# Patient Record
Sex: Male | Born: 1970 | Race: Black or African American | Hispanic: No | Marital: Single | Smoking: Never smoker
Health system: Southern US, Community
[De-identification: ages and names within clinical notes are randomized; demographics above are authoritative.]

## PROBLEM LIST (undated history)

## (undated) DIAGNOSIS — E669 Obesity, unspecified: Secondary | ICD-10-CM

## (undated) DIAGNOSIS — E785 Hyperlipidemia, unspecified: Secondary | ICD-10-CM

## (undated) DIAGNOSIS — I10 Essential (primary) hypertension: Secondary | ICD-10-CM

## (undated) DIAGNOSIS — F419 Anxiety disorder, unspecified: Secondary | ICD-10-CM

## (undated) DIAGNOSIS — F1910 Other psychoactive substance abuse, uncomplicated: Secondary | ICD-10-CM

## (undated) DIAGNOSIS — F32A Depression, unspecified: Secondary | ICD-10-CM

## (undated) DIAGNOSIS — F329 Major depressive disorder, single episode, unspecified: Secondary | ICD-10-CM

## (undated) HISTORY — DX: Other psychoactive substance abuse, uncomplicated: F19.10

## (undated) HISTORY — PX: OTHER SURGICAL HISTORY: SHX169

## (undated) HISTORY — PX: FOOT SURGERY: SHX648

## (undated) HISTORY — PX: TONSILLECTOMY: SUR1361

## (undated) HISTORY — DX: Essential (primary) hypertension: I10

## (undated) HISTORY — DX: Hyperlipidemia, unspecified: E78.5

## (undated) HISTORY — DX: Obesity, unspecified: E66.9

---

## 2011-06-30 ENCOUNTER — Encounter (HOSPITAL_COMMUNITY): Payer: Self-pay | Admitting: *Deleted

## 2011-06-30 ENCOUNTER — Emergency Department (HOSPITAL_COMMUNITY)
Admission: EM | Admit: 2011-06-30 | Discharge: 2011-06-30 | Disposition: A | Discharge status: Other Acute Inpatient Hospital | Payer: BC Managed Care – PPO | Attending: Emergency Medicine | Admitting: Emergency Medicine

## 2011-06-30 ENCOUNTER — Inpatient Hospital Stay (HOSPITAL_COMMUNITY)
Admission: EM | Admit: 2011-06-30 | Discharge: 2011-07-06 | DRG: 430 | Disposition: A | Discharge status: Home / Self Care | Payer: BC Managed Care – PPO | Attending: Psychiatry | Admitting: Psychiatry

## 2011-06-30 DIAGNOSIS — F29 Unspecified psychosis not due to a substance or known physiological condition: Secondary | ICD-10-CM

## 2011-06-30 DIAGNOSIS — G4700 Insomnia, unspecified: Secondary | ICD-10-CM | POA: Diagnosis present

## 2011-06-30 DIAGNOSIS — Z6841 Body Mass Index (BMI) 40.0 and over, adult: Secondary | ICD-10-CM

## 2011-06-30 DIAGNOSIS — F411 Generalized anxiety disorder: Secondary | ICD-10-CM | POA: Diagnosis present

## 2011-06-30 DIAGNOSIS — E669 Obesity, unspecified: Secondary | ICD-10-CM | POA: Diagnosis present

## 2011-06-30 DIAGNOSIS — I10 Essential (primary) hypertension: Secondary | ICD-10-CM | POA: Insufficient documentation

## 2011-06-30 DIAGNOSIS — F333 Major depressive disorder, recurrent, severe with psychotic symptoms: Principal | ICD-10-CM | POA: Diagnosis present

## 2011-06-30 DIAGNOSIS — R45851 Suicidal ideations: Secondary | ICD-10-CM

## 2011-06-30 DIAGNOSIS — F329 Major depressive disorder, single episode, unspecified: Secondary | ICD-10-CM

## 2011-06-30 DIAGNOSIS — F3289 Other specified depressive episodes: Secondary | ICD-10-CM | POA: Insufficient documentation

## 2011-06-30 HISTORY — DX: Depression, unspecified: F32.A

## 2011-06-30 HISTORY — DX: Anxiety disorder, unspecified: F41.9

## 2011-06-30 HISTORY — DX: Major depressive disorder, single episode, unspecified: F32.9

## 2011-06-30 HISTORY — DX: Essential (primary) hypertension: I10

## 2011-06-30 LAB — RAPID URINE DRUG SCREEN, HOSP PERFORMED
Amphetamines: NOT DETECTED
Cocaine: NOT DETECTED
Opiates: NOT DETECTED
Tetrahydrocannabinol: NOT DETECTED

## 2011-06-30 LAB — BASIC METABOLIC PANEL
CO2: 24 mEq/L (ref 19–32)
Glucose, Bld: 109 mg/dL — ABNORMAL HIGH (ref 70–99)
Potassium: 4 mEq/L (ref 3.5–5.1)
Sodium: 133 mEq/L — ABNORMAL LOW (ref 135–145)

## 2011-06-30 LAB — CBC
Hemoglobin: 15.9 g/dL (ref 13.0–17.0)
Platelets: 414 10*3/uL — ABNORMAL HIGH (ref 150–400)
RBC: 5.7 MIL/uL (ref 4.22–5.81)
WBC: 5.4 10*3/uL (ref 4.0–10.5)

## 2011-06-30 LAB — ETHANOL: Alcohol, Ethyl (B): <11 mg/dL (ref 0–11)

## 2011-06-30 MED ORDER — RISPERIDONE 1 MG PO TABS
1.0000 mg | ORAL_TABLET | Freq: Two times a day (BID) | ORAL | Status: DC
  Administered 2011-06-30: 1 mg via ORAL
  Filled 2011-06-30 (×2): qty 1

## 2011-06-30 MED ORDER — IBUPROFEN 200 MG PO TABS: 600.0000 mg | ORAL_TABLET | Freq: Three times a day (TID) | ORAL | Status: DC | PRN

## 2011-06-30 MED ORDER — CITALOPRAM HYDROBROMIDE 20 MG PO TABS
20.0000 mg | ORAL_TABLET | ORAL | Status: DC
  Filled 2011-06-30: qty 1

## 2011-06-30 MED ORDER — RISPERIDONE 1 MG PO TABS
1.0000 mg | ORAL_TABLET | Freq: Two times a day (BID) | ORAL | Status: DC
  Filled 2011-06-30 (×2): qty 1

## 2011-06-30 MED ORDER — TRAZODONE HCL 50 MG PO TABS
50.0000 mg | ORAL_TABLET | Freq: Every day | ORAL | Status: DC
  Filled 2011-06-30: qty 1

## 2011-06-30 MED ORDER — ACETAMINOPHEN 325 MG PO TABS: 650.0000 mg | ORAL_TABLET | ORAL | Status: DC | PRN

## 2011-06-30 MED ORDER — LISINOPRIL 20 MG PO TABS
20.0000 mg | ORAL_TABLET | ORAL | Status: DC
  Filled 2011-06-30: qty 1

## 2011-06-30 MED ORDER — TRAZODONE HCL 50 MG PO TABS
50.0000 mg | ORAL_TABLET | Freq: Every day | ORAL | Status: DC
  Filled 2011-06-30 (×2): qty 1

## 2011-06-30 MED ORDER — LORAZEPAM 1 MG PO TABS
1.0000 mg | ORAL_TABLET | Freq: Three times a day (TID) | ORAL | Status: DC | PRN
  Administered 2011-06-30: 1 mg via ORAL
  Filled 2011-06-30: qty 1

## 2011-06-30 MED ORDER — HYDROCHLOROTHIAZIDE 12.5 MG PO CAPS
12.5000 mg | ORAL_CAPSULE | Freq: Once | ORAL | Status: AC
  Administered 2011-06-30: 12.5 mg via ORAL
  Filled 2011-06-30: qty 1

## 2011-06-30 MED ORDER — RISPERIDONE 1 MG PO TABS
1.0000 mg | ORAL_TABLET | ORAL | Status: DC
  Filled 2011-06-30: qty 1

## 2011-06-30 MED ORDER — HYDROCHLOROTHIAZIDE 12.5 MG PO CAPS
12.5000 mg | ORAL_CAPSULE | Freq: Every day | ORAL | Status: DC
  Filled 2011-06-30: qty 1

## 2011-06-30 MED ORDER — CITALOPRAM HYDROBROMIDE 20 MG PO TABS
20.0000 mg | ORAL_TABLET | Freq: Every day | ORAL | Status: DC
  Administered 2011-06-30: 20 mg via ORAL
  Filled 2011-06-30: qty 1

## 2011-06-30 MED ORDER — ALUM & MAG HYDROXIDE-SIMETH 200-200-20 MG/5ML PO SUSP: 30.0000 mL | ORAL | Status: DC | PRN

## 2011-06-30 MED ORDER — LISINOPRIL 10 MG PO TABS
10.0000 mg | ORAL_TABLET | Freq: Once | ORAL | Status: AC
  Administered 2011-06-30: 10 mg via ORAL
  Filled 2011-06-30: qty 1

## 2011-06-30 MED ORDER — ZOLPIDEM TARTRATE 5 MG PO TABS: 5.0000 mg | ORAL_TABLET | Freq: Every evening | ORAL | Status: DC | PRN

## 2011-06-30 MED ORDER — ONDANSETRON HCL 8 MG PO TABS: 4.0000 mg | ORAL_TABLET | Freq: Three times a day (TID) | ORAL | Status: DC | PRN

## 2011-06-30 MED ORDER — ACETAMINOPHEN 325 MG PO TABS: 650.0000 mg | ORAL_TABLET | Freq: Four times a day (QID) | ORAL | Status: DC | PRN

## 2011-06-30 MED ORDER — LISINOPRIL 20 MG PO TABS
20.0000 mg | ORAL_TABLET | Freq: Two times a day (BID) | ORAL | Status: DC
  Filled 2011-06-30 (×2): qty 1

## 2011-06-30 MED ORDER — CITALOPRAM HYDROBROMIDE 20 MG PO TABS
20.0000 mg | ORAL_TABLET | Freq: Every day | ORAL | Status: DC
  Filled 2011-06-30: qty 1

## 2011-06-30 MED ORDER — TRAZODONE HCL 50 MG PO TABS
50.0000 mg | ORAL_TABLET | ORAL | Status: DC
  Filled 2011-06-30: qty 1

## 2011-06-30 MED ORDER — MAGNESIUM HYDROXIDE 400 MG/5ML PO SUSP: 30.0000 mL | Freq: Every day | ORAL | Status: DC | PRN

## 2011-06-30 MED ORDER — HYDROCHLOROTHIAZIDE 12.5 MG PO CAPS
12.5000 mg | ORAL_CAPSULE | ORAL | Status: DC
  Filled 2011-06-30: qty 1

## 2011-06-30 NOTE — ED Notes (Signed)
Pt reports severe depression x3 weeks, AH, reports hearing noises in the walls x3 weeks, hearing voices telling him to harm himself approx 0100 this am after leaving [facility], SI w/o a plan of action, denies HI

## 2011-06-30 NOTE — BH Assessment (Signed)
Assessment Note   Jesse Mayer is an 41 y.o. male who is being presented to the ED due to having hallucinations both auditory and visual.  Per report of his wife the pt. Hallucinations and bizarre behaviors has increased over the course of two weeks.  He was on medications for his mental health needs and hasn't taken them in close to a year.  The medicaitons he believe he was on were; Celexa, Trazodone and risperdal.  He was placed on the medications due to having hallucinations in the past.  The wife reports that he was seeing things and hearing voices coming from walls.  She also reports that he was having delusions that were paranoid in nature.  Also during this time he was a "heavy" drinker.  Due to his mental health status it resulted in him drinking acid and it resulted in him being hospitalized (Voices told him to do it).  They both are concerned because he is displaying the same symptoms as he was before and he is "rapidly declining."    During the assessment the pt. Was pleasant and polite. He was able to answer questions but had trouble remembering dates.  He has a good understanding that he needs help and seeking to get voluntary.  He stated that the voices were increasing and he was tired of living like this and want them to stop.  He had no eye contact, but kept the blanket over his eyes in order that the "light in the room would not hurt (his) eyes"  Axis I: Psychotic Disorder NOS Axis II: Deferred Axis II:  Past Medical History:  Past Medical History  Diagnosis Date  . Hypertension    Axis IV: Poor adjustment to work demands. Axis V: 20  History reviewed. No pertinent past surgical history.  Family History: History reviewed. No pertinent family history.  Social History:  reports that he has been smoking.  He does not have any smokeless tobacco history on file. He reports that he does not drink alcohol or use illicit drugs.  Additional Social History:  Alcohol / Drug  Use Pain Medications: None reported Over the Counter: None reported History of alcohol / drug use?: Yes Substance #1 Name of Substance 1: Alcohol 1 - Age of First Use: Teenager 1 - Amount (size/oz): Unknown 1 - Frequency: Unknown (Unable to share how much he drank) 1 - Duration: Several years 1 - Last Use / Amount: Reports last use was over two years ago Allergies: No Known Allergies  Home Medications:  Medications Prior to Admission  Medication Dose Route Frequency Provider Last Rate Last Dose  . acetaminophen (TYLENOL) tablet 650 mg  650 mg Oral Q4H PRN Dayton Bailiff, MD      . alum & mag hydroxide-simeth (MAALOX/MYLANTA) 200-200-20 MG/5ML suspension 30 mL  30 mL Oral PRN Dayton Bailiff, MD      . ibuprofen (ADVIL,MOTRIN) tablet 600 mg  600 mg Oral Q8H PRN Dayton Bailiff, MD      . LORazepam (ATIVAN) tablet 1 mg  1 mg Oral Q8H PRN Dayton Bailiff, MD      . ondansetron [facility]) tablet 4 mg  4 mg Oral Q8H PRN Dayton Bailiff, MD      . zolpidem [facility]) tablet 5 mg  5 mg Oral QHS PRN Dayton Bailiff, MD       No current outpatient prescriptions on file as of 06/30/2011.    OB/GYN Status:  No LMP for male patient.  General Assessment Data Location of  Assessment: [facility] ED ACT Assessment: Yes Living Arrangements: Spouse/significant other Can pt return to current living arrangement?: Yes Admission Status: Voluntary Is patient capable of signing voluntary admission?: Yes Transfer from: Acute Hospital Referral Source: Self/Family/Friend  Education Status Is patient currently in school?: No  Risk to self Suicidal Ideation: Yes-Currently Present (Currently due to the voices in his head.) Suicidal Intent: No Is patient at risk for suicide?: Yes Suicidal Plan?: No Access to Means: No What has been your use of drugs/alcohol within the last 12 months?: None reported Previous Attempts/Gestures: No How many times?: 0  Other Self Harm Risks: None reported Triggers for Past Attempts:  Hallucinations Intentional Self Injurious Behavior: None Family Suicide History: Yes (Grandfather) Recent stressful life event(s): Other (Comment) (Started new job and haven't adjusted to working) Persecutory voices/beliefs?: Yes Depression: Yes Depression Symptoms: Insomnia;Tearfulness;Isolating;Fatigue;Loss of interest in usual pleasures;Feeling worthless/self pity;Feeling angry/irritable Substance abuse history and/or treatment for substance abuse?: Yes Suicide prevention information given to non-admitted patients: Not applicable  Risk to Others Homicidal Ideation: No Thoughts of Harm to Others: No Current Homicidal Intent: No Current Homicidal Plan: No Access to Homicidal Means: No Identified Victim: None reported History of harm to others?: No Assessment of Violence: None Noted Violent Behavior Description: None noted Does patient have access to weapons?: No Criminal Charges Pending?: No Does patient have a court date: No  Psychosis Hallucinations: Auditory;Visual;With command Delusions: Persecutory  Mental Status Report Appear/Hygiene: Poor hygiene;Body odor Eye Contact: Poor Motor Activity: Other (Comment) (Laying in bed) Speech: Logical/coherent;Soft Level of Consciousness: Alert Mood: Depressed;Anxious;Empty Affect: Depressed;Sad Anxiety Level: Minimal Thought Processes: Coherent;Relevant Judgement: Impaired Orientation: Person;Place;Time;Situation Obsessive Compulsive Thoughts/Behaviors: None  Cognitive Functioning Concentration: Normal Memory: Recent Intact;Remote Intact IQ: Average Insight: Fair Impulse Control: Fair Appetite: Poor Weight Loss: 0  Weight Gain: 0  Sleep: Decreased Total Hours of Sleep: 2  Vegetative Symptoms: None  Prior Inpatient Therapy Prior Inpatient Therapy: Yes Prior Therapy Dates: 04/211 Prior Therapy Facilty/Provider(s): [facility] Reason for Treatment: Due to mental health needs.  He reports of having  hallucinations  Prior Outpatient Therapy Prior Outpatient Therapy: Yes Prior Therapy Dates: 2011-2012 Prior Therapy Facilty/Provider(s): [facility] Reason for Treatment: Mental health needs due to hallucinations            Values / Beliefs Cultural Requests During Hospitalization: None Spiritual Requests During Hospitalization: None        Additional Information 1:1 In Past 12 Months?: No CIRT Risk: No Elopement Risk: No Does patient have medical clearance?: Yes     Disposition:  Disposition Disposition of Patient: Inpatient treatment program Type of inpatient treatment program: Adult  On Site Evaluation by:   Reviewed with Physician:     Morley Kos 06/30/2011 9:43 AM

## 2011-06-30 NOTE — ED Notes (Signed)
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

## 2011-06-30 NOTE — ED Notes (Signed)
Pt's spouse, Melissa's # [phone]

## 2011-06-30 NOTE — BHH Counselor (Signed)
Information faxed to [facility] and [facility] for placement.

## 2011-06-30 NOTE — ED Notes (Signed)
Patient is resting comfortably, nad, rr even and unlabored, will continue to monitor

## 2011-06-30 NOTE — ED Notes (Signed)
Pt reports SI for the past 3 weeks.  Pt is hearing voices that are telling him to harm himself.  No specific plan.  No attempt.  Pt moved in December and started a new job.  pts wife at bedside.  Denies HI.

## 2011-06-30 NOTE — ED Notes (Signed)
Received bedside report from [place], [place].  Patient currently sitting up in bed; no respiratory or acute distress noted.  Sitter present at bedside.  Patient has bed ready at [facility]; calling report now.

## 2011-06-30 NOTE — ED Provider Notes (Addendum)
History     CSN: [id number]  Arrival date & time 06/30/11  6195   First MD Initiated Contact with Patient 06/30/11 [phone]      Chief Complaint  Patient presents with  . Psychiatric Evaluation    (Consider location/radiation/quality/duration/timing/severity/associated sxs/prior treatment) Patient is a 41 y.o. male presenting with mental health disorder. The history is provided by the patient. No language interpreter was used.  Mental Health Problem The primary symptoms include hallucinations. The primary symptoms do not include dysphoric mood or delusions. Episode onset: 3 weeks ago. This is a recurrent problem.  The hallucinations began more than 2 weeks ago. The hallucinations appear to have been worsening since their onset. He has auditory hallucinations.  The onset of the illness is precipitated by a stressful event and emotional stress. The degree of incapacity that he is experiencing as a consequence of his illness is moderate. Sequelae of the illness include an inability to work. Additional symptoms of the illness do not include no anhedonia, no insomnia, no appetite change, no fatigue, no headaches or no abdominal pain. He admits to suicidal ideas. He does not have a plan to commit suicide. He does not contemplate harming himself. He has not already injured self. He does not contemplate injuring another person. He has not already  injured another person. Risk factors that are present for mental illness include a history of mental illness and substance abuse.    Past Medical History  Diagnosis Date  . Hypertension     History reviewed. No pertinent past surgical history.  History reviewed. No pertinent family history.  History  Substance Use Topics  . Smoking status: Current Everyday Smoker -- 1.0 packs/day  . Smokeless tobacco: Not on file  . Alcohol Use: No      Review of Systems  Constitutional: Negative for fever, activity change, appetite change and fatigue.  HENT:  Negative for congestion, sore throat, rhinorrhea, neck pain and neck stiffness.   Respiratory: Negative for cough and shortness of breath.   Cardiovascular: Negative for chest pain and palpitations.  Gastrointestinal: Negative for nausea, vomiting and abdominal pain.  Genitourinary: Negative for dysuria, urgency, frequency and flank pain.  Musculoskeletal: Negative for myalgias, back pain and arthralgias.  Neurological: Negative for dizziness, weakness, light-headedness, numbness and headaches.  Psychiatric/Behavioral: Positive for suicidal ideas and hallucinations. Negative for self-injury and dysphoric mood. The patient is nervous/anxious. The patient does not have insomnia.   All other systems reviewed and are negative.    Allergies  Review of patient's allergies indicates no known allergies.  Home Medications   Current Outpatient Rx  Name Route Sig Dispense Refill  . HYDROCHLOROTHIAZIDE 12.5 MG PO CAPS Oral Take 12.5 mg by mouth daily.    [name] LISINOPRIL 20 MG PO TABS Oral Take 20 mg by mouth 2 (two) times daily.      BP 120/78  Pulse 83  Temp(Src) 98.3 F (36.8 C) (Oral)  Resp 16  SpO2 94%  Physical Exam  Nursing note and vitals reviewed. Constitutional: He is oriented to person, place, and time. He appears well-developed and well-nourished. No distress.  HENT:  Head: Normocephalic and atraumatic.  Mouth/Throat: Oropharynx is clear and moist.  Eyes: Conjunctivae and EOM are normal. Pupils are equal, round, and reactive to light.  Neck: Normal range of motion. Neck supple.  Cardiovascular: Normal rate, regular rhythm, normal heart sounds and intact distal pulses.  Exam reveals no gallop and no friction rub.   No murmur heard. Pulmonary/Chest: Effort normal and  breath sounds normal. No respiratory distress. He exhibits no tenderness.  Abdominal: Soft. Bowel sounds are normal. There is no tenderness.  Musculoskeletal: Normal range of motion. He exhibits no edema and no  tenderness.  Neurological: He is alert and oriented to person, place, and time. No cranial nerve deficit.  Skin: Skin is warm and dry. No rash noted.  Psychiatric: His speech is delayed. He is slowed and withdrawn. He exhibits a depressed mood. He expresses suicidal ideation. He expresses no homicidal ideation. He expresses no suicidal plans and no homicidal plans.    ED Course  Procedures (including critical care time)  Labs Reviewed  CBC - Abnormal; Notable for the following:    Platelets 414 (*)    All other components within normal limits  BASIC METABOLIC PANEL - Abnormal; Notable for the following:    Sodium 133 (*)    Glucose, Bld 109 (*)    All other components within normal limits  ETHANOL  URINE RAPID DRUG SCREEN (HOSP PERFORMED)   No results found.   1. Depression   2. Psychosis   3. Suicidal ideation       MDM  Depression, psychosis, suicidal ideation. Has been off medications for over a year. Has a history of the same. Medically clear for psychiatric evaluation. I feel this is secondary to his psychiatric problem rather than organic process. We'll await disposition by the psychiatry team. Discussed with the ACT team. Will warrant a specialist on call consult. Psychiatric orders placed. Evaluated by specialist on call and will warrant admission. Medication recommendations placed        Dayton Bailiff, MD 06/30/11 1191  Dayton Bailiff, MD 06/30/11 [phone]

## 2011-06-30 NOTE — BH Assessment (Signed)
Assessment Note   Robinette Haines, ACT counselor at [facility], requested Pt be considered for inpatient treatment at [facility]. Consulted with Alease Frame, [facility] [employer] who confirmed bed availability. Verne Spurr, PA reviewed Pt's clinical information and accepted Pt to Dr. Harvie Heck Readling, room 401-1. Communicated disposition to [facility].    Patsy Baltimore, Harlin Rain 06/30/2011 6:20 PM

## 2011-06-30 NOTE — ED Notes (Signed)
Security paged again; still not here to transport patient.  Consulting civil engineer notified.

## 2011-06-30 NOTE — ED Notes (Signed)
Security still not here to transport patient; re-paged.

## 2011-06-30 NOTE — ED Notes (Addendum)
Given report to Marvia Pickles, RN at [facility].  Patient to be transferred to room 401-1 on adult unit at [facility].  Informed Marvia Pickles of trazodone and risperdal that have been ordered by EDP at 2015; RN wants to with-hold medications at this time until patient is assessed over at behavioral health.  Belongings (per day shift RN) are with wife (Melissa) at bedside -- only valuable reported/documented is wallet.  Preparing patient for transport; calling security (patient voluntary).

## 2011-06-30 NOTE — ED Notes (Signed)
Security paged for transport; patient's wife given coffee.
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
## 2011-06-30 NOTE — ED Notes (Signed)
Security here to transport patient to facility.

## 2011-07-01 DIAGNOSIS — F29 Unspecified psychosis not due to a substance or known physiological condition: Secondary | ICD-10-CM

## 2011-07-01 DIAGNOSIS — F333 Major depressive disorder, recurrent, severe with psychotic symptoms: Secondary | ICD-10-CM | POA: Diagnosis present

## 2011-07-01 MED ORDER — NICOTINE 21 MG/24HR TD PT24
21.0000 mg | MEDICATED_PATCH | Freq: Every day | TRANSDERMAL | Status: DC
  Administered 2011-07-01 – 2011-07-05 (×5): 21 mg via TRANSDERMAL
  Filled 2011-07-01 (×8): qty 1

## 2011-07-01 MED ORDER — HYDROXYZINE HCL 50 MG PO TABS
50.0000 mg | ORAL_TABLET | Freq: Every evening | ORAL | Status: DC | PRN
  Administered 2011-07-01 – 2011-07-02 (×2): 50 mg via ORAL
  Filled 2011-07-01: qty 1

## 2011-07-01 MED ORDER — CITALOPRAM HYDROBROMIDE 20 MG PO TABS
20.0000 mg | ORAL_TABLET | Freq: Every day | ORAL | Status: DC
  Administered 2011-07-01 – 2011-07-02 (×2): 20 mg via ORAL
  Filled 2011-07-01 (×3): qty 1

## 2011-07-01 MED ORDER — RISPERIDONE 0.5 MG PO TABS
ORAL_TABLET | ORAL | Status: AC
  Filled 2011-07-01: qty 1

## 2011-07-01 MED ORDER — MAGNESIUM HYDROXIDE 400 MG/5ML PO SUSP: 30.0000 mL | Freq: Every day | ORAL | Status: DC | PRN

## 2011-07-01 MED ORDER — RISPERIDONE 0.5 MG PO TABS
0.5000 mg | ORAL_TABLET | ORAL | Status: AC
  Administered 2011-07-01: 01:00:00 via ORAL
  Administered 2011-07-01: 0.5 mg via ORAL
  Filled 2011-07-01: qty 1

## 2011-07-01 MED ORDER — RISPERIDONE 0.5 MG PO TBDP: 0.5000 mg | ORAL_TABLET | Freq: Four times a day (QID) | ORAL | Status: DC | PRN

## 2011-07-01 MED ORDER — ALUM & MAG HYDROXIDE-SIMETH 200-200-20 MG/5ML PO SUSP: 30.0000 mL | ORAL | Status: DC | PRN

## 2011-07-01 MED ORDER — ACETAMINOPHEN 325 MG PO TABS: 650.0000 mg | ORAL_TABLET | Freq: Four times a day (QID) | ORAL | Status: DC | PRN

## 2011-07-01 MED ORDER — TRAZODONE HCL 50 MG PO TABS
50.0000 mg | ORAL_TABLET | Freq: Every evening | ORAL | Status: DC | PRN
  Administered 2011-07-01: 50 mg via ORAL
  Filled 2011-07-01: qty 1

## 2011-07-01 MED ORDER — RISPERIDONE 1 MG PO TABS
1.0000 mg | ORAL_TABLET | Freq: Every day | ORAL | Status: DC
  Administered 2011-07-01: 1 mg via ORAL
  Filled 2011-07-01 (×2): qty 1

## 2011-07-01 NOTE — H&P (Signed)
I have read the H&P, interviewed the patient, and I agree with the findings above.  Jeromiah Ohalloran, MD   

## 2011-07-01 NOTE — Progress Notes (Addendum)
[facility] Adult Inpatient Family/Significant Other Suicide Prevention Education  Suicide Prevention Education:  Contact Attempts: Efraim Kaufmann (wife) H [phone] C [phone], (name of family member/significant other) has been identified by the patient as the family member/significant other with whom the patient will be residing, and identified as the person(s) who will aid the patient in the event of a mental health crisis.  With written consent from the patient, two attempts were made to provide suicide prevention education, prior to and/or following the patient's discharge.  We were unsuccessful in providing suicide prevention education.  A suicide education pamphlet was given to the patient to share with family/significant other.  Date and time of first attempt: 07/01/11 at 2:49 PM [facility] 07/01/2011, 2:48 PM [facility] Adult Inpatient Family/Significant Other Suicide Prevention Education  Suicide Prevention Education:  Education Completed; Efraim Kaufmann (wife),  (name of family member/significant other) has been identified by the patient as the family member/significant other with whom the patient will be residing, and identified as the person(s) who will aid the patient in the event of a mental health crisis (suicidal ideations/suicide attempt).  With written consent from the patient, the family member/significant other has been provided the following suicide prevention education, prior to the and/or following the discharge of the patient.  The suicide prevention education provided includes the following:  Suicide risk factors  Suicide prevention and interventions  National Suicide Hotline telephone number  [facility] assessment telephone number  [facility] Emergency Assistance 911  [facility] and/or Residential Mobile Crisis Unit telephone number  Request made of family/significant other to:  Remove weapons (e.g., guns, rifles, knives), all items previously/currently identified as  safety concern.    Remove drugs/medications (over-the-counter, prescriptions, illicit drugs), all items previously/currently identified as a safety concern.  The family member/significant other verbalizes understanding of the suicide prevention education information provided.  The family member/significant other agrees to remove the items of safety concern listed above.   Pt. accepted information on suicide prevention, warning signs to look for with suicide and crisis line numbers to use. The pt. agreed to call crisis line numbers if having warning signs or having thoughts of suicide.   Wife stated that Bipolar runs in his family and that several other family members have bipolar. She has no safety concerns and wants to be active in the pt's treatment.  [facility] 07/01/2011, 4:11 PM

## 2011-07-01 NOTE — Progress Notes (Signed)
[facility] Group Notes:  (Counselor/Nursing/MHT/Case Management/Adjunct)  07/01/2011 10:30 AM  Type of Therapy:  Group Therapy, Dance/Movement Therapy   Participation Level:  Did Not Attend  Yevette Knust   
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
## 2011-07-01 NOTE — H&P (Signed)
  Psychiatric Admission Assessment Adult  Patient Identification:  Jesse Mayer Date of Evaluation:  07/01/2011 40yo MAAM CC: AH command in nature to hurt himself History of Present Illness: Is working in [place] and had been to ED twice earlier this week. Then he tried to access mental health in [place] but they couldn't see for a few weeks and since he resides in [place] followed suggestion to come here.  He tried LSD about 2 years ago and this initiated his psychosis. Was hospitalized in [place] for about 10 days but hasn't meds for a year. Now is seeing and hearing things especially from the walls.     Past Psychiatric History: As above  Substance Abuse History:  Social History:    reports that he has been smoking Cigarettes.  He has a 20 pack-year smoking history. He does not have any smokeless tobacco history on file. He reports that he drinks about .6 ounces of alcohol per week. He reports that he does not use illicit drugs. Voices used to tell him to drink and that is how he came to do the LSD.   Family Psych History: Denies   Past Medical History:     Past Medical History  Diagnosis Date  . Hypertension   . Anxiety   . Depression        Past Surgical History  Procedure Date  . Tonsillectomy     childhood    Allergies: No Known Allergies  Current Medications:  Prior to Admission medications   Medication Sig Start Date End Date Taking? Authorizing Provider  hydrochlorothiazide (MICROZIDE) 12.5 MG capsule Take 12.5 mg by mouth daily.   Yes Historical Provider, MD  lisinopril (PRINIVIL,ZESTRIL) 20 MG tablet Take 20 mg by mouth 2 (two) times daily.   Yes Historical Provider, MD    Mental Status Examination/Evaluation: Objective:  Appearance: Casual  Psychomotor Activity:  Decreased  Eye Contact::  Good  Speech:  Clear and Coherent  Volume:  Normal  Mood: depressed    Affect:  Congruent  Thought Process: clear ratiopnal goal oriented - get  back on meds   Orientation:  Full  Thought Content:  Hallucinations: Auditory Command:   Visual and   Suicidal Thoughts:  No  Homicidal Thoughts:  No  Judgement:  Intact  Insight:  Good    DIAGNOSIS:    AXIS I Psychotic Disorder NOS  AXIS II Deferred  AXIS III See medical history.  AXIS IV occupational problems and problems with primary support group  AXIS V 11-20 some danger of hurting self or others possible OR occasionally fails to maintain minimal personal hygiene OR gross impairment in communication     Treatment Plan Summary: Admit for safety and stabilization Start Risperdal for psychosis  Get into mental Health clinic  Agree with H&P from ED

## 2011-07-01 NOTE — Progress Notes (Signed)
Pt has been in room much of the day, very minimal interaction and participation in milieu, pt had visit from wife this evening, pt has endorsed feelings of depression and also auditory hallucinations, pt has not verbalized any complaints, will continue to monitor
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
## 2011-07-02 DIAGNOSIS — F333 Major depressive disorder, recurrent, severe with psychotic symptoms: Principal | ICD-10-CM

## 2011-07-02 DIAGNOSIS — F411 Generalized anxiety disorder: Secondary | ICD-10-CM

## 2011-07-02 MED ORDER — LISINOPRIL 20 MG PO TABS: 20.0000 mg | ORAL_TABLET | Freq: Two times a day (BID) | ORAL | Status: DC

## 2011-07-02 MED ORDER — TRAZODONE HCL 100 MG PO TABS
100.0000 mg | ORAL_TABLET | Freq: Every day | ORAL | Status: DC
  Administered 2011-07-02: 100 mg via ORAL
  Filled 2011-07-02 (×3): qty 1

## 2011-07-02 MED ORDER — CITALOPRAM HYDROBROMIDE 40 MG PO TABS
40.0000 mg | ORAL_TABLET | Freq: Every day | ORAL | Status: DC
  Administered 2011-07-03 – 2011-07-06 (×4): 40 mg via ORAL
  Filled 2011-07-02 (×5): qty 1

## 2011-07-02 MED ORDER — RISPERIDONE 2 MG PO TABS
2.0000 mg | ORAL_TABLET | Freq: Every day | ORAL | Status: DC
  Administered 2011-07-02 – 2011-07-05 (×4): 2 mg via ORAL
  Filled 2011-07-02 (×5): qty 1

## 2011-07-02 MED ORDER — LISINOPRIL 20 MG PO TABS
20.0000 mg | ORAL_TABLET | Freq: Every day | ORAL | Status: DC
  Administered 2011-07-02 – 2011-07-06 (×5): 20 mg via ORAL
  Filled 2011-07-02 (×7): qty 1

## 2011-07-02 MED ORDER — HYDROCHLOROTHIAZIDE 12.5 MG PO CAPS
12.5000 mg | ORAL_CAPSULE | Freq: Every day | ORAL | Status: DC
  Administered 2011-07-02 – 2011-07-06 (×5): 12.5 mg via ORAL
  Filled 2011-07-02 (×7): qty 1

## 2011-07-02 NOTE — Progress Notes (Signed)
Currently resting quietly in bed in supine position with eyes closed. Respirations are even and unlabored. No acute distress noted. Safety has been maintained with Q15 minute observation. Will continue current POC.  

## 2011-07-02 NOTE — Progress Notes (Signed)
Pt. Has flat affect and depressed mood.  Denies SI/HI but would not state whether he hears voices or not.  Pt. Contracts for safety.  No issues voiced.

## 2011-07-02 NOTE — Tx Team (Signed)
Interdisciplinary Treatment Plan Update (Adult)  Date:  07/02/2011  Time Reviewed:  10:15AM-11:00AM  Progress in Treatment: Attending groups:  Yes Participating in groups:    Yes Taking medication as prescribed:    Yes Tolerating medication:   Yes Family/Significant other contact made:  Yes, with wife Patient understands diagnosis:   Yes Discussing patient identified problems/goals with staff:   Yes Medical problems stabilized or resolved:   Yes Denies suicidal/homicidal ideation:  Is suicidal off and on, hears voices telling him to kill himself Issues/concerns per patient self-inventory:   None Other:    New problem(s) identified: No, Describe:    Reason for Continuation of Hospitalization: Aggression Delusions  Medication stabilization Suicidal ideation Other; describe hopelessness, ringing in ears  Interventions implemented related to continuation of hospitalization:  Medication monitoring and adjustment, safety checks Q15 min., suicide risk assessment, group therapy, psychoeducation, collateral contact, aftercare planning, ongoing physician assessments, medication education  Additional comments:  Not applicable  Estimated length of stay:  3-5 days  Discharge Plan:  Lives with wife and 2 children.  Follow-up to be researched and set up.  Works in [place], [place], but lives in [place], [place].  New goal(s):  Not applicable  Review of initial/current patient goals per problem list:   1.  Goal(s):  Deny SI for 48 hours prior to D/C.  Met:  No  Target date:  By Discharge   As evidenced by:  Currently has off and on suicidal ideation  2.  Goal(s):  Secure aftercare of therapist/psychiatrist for patient, as he is new to area.  Met:  No  Target date:  By Discharge   As evidenced by:  To research and set appointments  3.  Goal(s):  Reduce auditory hallucinations to baseline, and report command AH no longer telling him to hurt self or others.  Met:   No  Target date:  By Discharge   As evidenced by:  Still having constant AH  4.  Goal(s):  Medication stabilization.  Met:  No  Target date:  By Discharge   As evidenced by:  On meds, not stable yet  5.  Goal(s): Reduce depression, anxiety and hopelessness from admission levels of 10, 5, and 9 to no more than 3 at discharge.  Met:  No  Target date:  By Discharge   As evidenced by:  Today at 4, 4-5, 9   Attendees: Patient:  Jesse Mayer  07/02/2011 10:15AM-11:00AM  Family:     Physician:  Dr. Harvie Heck Readling 07/02/2011 10:15AM-11:00AM  Nursing:   Manuela Schwartz, RN 07/02/2011 10:15AM -11:00AM   Case Manager:  Ambrose Mantle, LCSW 07/02/2011 10:15AM-11:00AM  Counselor:  Veto Kemps, MT-BC 07/02/2011 10:15AM-11:00AM  Other:   Neill Loft, RN 07/02/2011 10:15AM-11:00AM  Other:   Osborn Coho, LCSW-A 07/02/2011 10:15AM-11:00AM  Other:      Other:       Scribe for Treatment Team:   Sarina Ser, 07/02/2011, 10:15AM-11:15AM

## 2011-07-02 NOTE — Progress Notes (Signed)
Patient ID: Jesse Mayer, male   DOB: 05-26-1970, 41 y.o.   MRN: [id number] The patient is very quiet and withdrawn. Is interactions in the milieu are minimal. Isolated a lot in his room. Stated that he is still hearing voices that tell him to hurt himself. Compliant with medication. Denied any suicidal ideation.

## 2011-07-02 NOTE — Progress Notes (Signed)
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  Generalized Anxiety Disorder.  The patient was seen today and reports the following:   Sleep: The patient reports to sleeping well currently but was having difficulty sleeping prior to admission.  Appetite: The patient reports that his appetite is improving.  He reports prior to admission his appetite was decreased.   Mild>(1-10) >Severe  Hopelessness (1-10): 9  Depression (1-10): 9  Anxiety (1-10): 4-5   Suicidal Ideation: The patient reports suicidal ideations "on and off" but with no plan or intent.  Plan: No  Intent: No  Means: No   Homicidal Ideation: The patient adamantly denies any homicidal ideations today.  Plan: No  Intent: No.  Means: No   Eye Contact: Good.  General Appearance/Behavior: The patient was friendly and cooperative during the interview today.  Motor Behavior: wnl.  Speech: Appropriate in rate and volume with no pressuring of speech today.  Mental Status: Alert and Oriented x 3  Level of Consciousness: Alert  Mood: Severely Depressed.  Affect: Severely Constricted.  Anxiety: Moderate anxiety reported today.  Thought Process: Auditory hallucinations reported.  Thought Content: The patient reports auditory hallucinations today instructing him to "kill myself and I am not worth anything."  Perception: Auditory hallucinations reported.  Judgment: Fair to Good.  Insight: Fair to Good.  Cognition: Orientated to person, place and time. Sleep:  Number of Hours: 6.75    Vital Signs:Blood pressure 123/84, pulse 90, temperature 97.5 F (36.4 C), temperature source Oral, resp. rate 20, height 6\' 4"  (1.93 m), weight 160.573 kg (354 lb).  Current Medications: Current Facility-Administered Medications  Medication Dose Route Frequency Provider Last Rate Last Dose  . acetaminophen (TYLENOL) tablet 650 mg  650 mg Oral Q6H PRN Larena Sox, MD      . alum & mag hydroxide-simeth (MAALOX/MYLANTA) 200-200-20 MG/5ML suspension 30 mL  30 mL Oral Q4H PRN Larena Sox, MD      . citalopram (CELEXA) tablet 40 mg  40 mg Oral Daily Will Heinkel D Avina Eberle, MD      . hydrochlorothiazide (MICROZIDE) capsule 12.5 mg  12.5 mg Oral Daily Mavric Cortright D Devrin Monforte, MD      . hydrOXYzine (ATARAX/VISTARIL) tablet 50 mg  50 mg Oral QHS PRN,MR X 1 Larena Sox, MD   50 mg at 07/01/11 0027  . lisinopril (PRINIVIL,ZESTRIL) tablet 20 mg  20 mg Oral Q breakfast Curlene Labrum Berkley Cronkright, MD      . magnesium hydroxide (MILK OF MAGNESIA) suspension 30 mL  30 mL Oral Daily PRN Larena Sox, MD      . nicotine (NICODERM CQ - dosed in mg/24 hours) patch 21 mg  21 mg Transdermal Q0600 Curlene Labrum Obie Kallenbach, MD   21 mg at 07/02/11 1610  . risperiDONE (RISPERDAL M-TABS) disintegrating tablet 0.5 mg  0.5 mg Oral Q6H PRN Mickie D. Adams, PA      . risperiDONE (RISPERDAL) tablet 2 mg  2 mg Oral QHS Leaha Cuervo D Kenasia Scheller, MD      . traZODone (DESYREL) tablet 100 mg  100 mg Oral QHS Curlene Labrum Sumaiya Arruda, MD      . DISCONTD: citalopram (CELEXA) tablet 20 mg  20 mg Oral Daily Mickie D. Adams, PA   20 mg at 07/02/11 0827  . DISCONTD: lisinopril (PRINIVIL,ZESTRIL) tablet 20 mg  20 mg Oral BID Ronny Bacon, MD      .  DISCONTD: risperiDONE (RISPERDAL) tablet 1 mg  1 mg Oral QHS Mickie D. Adams, PA   1 mg at 07/01/11 2146  . DISCONTD: traZODone (DESYREL) tablet 50 mg  50 mg Oral QHS PRN,MR X 1 Mickie D. Adams, PA   50 mg at 07/01/11 2145   Lab Results: No results found for this or any previous visit (from the past 48 hour(s)).  Time was spent today discussing with the patient his current symptoms. The patient states he is now sleeping reasonably well but was not sleeping prior to admission.  He reports severe depression with suicidal ideations "on and off."  He also reports moderate anxiety and auditory hallucinations telling him that he "is not worth anything and should kill myself."  The  patient states that all of these symptoms began 2 years ago after he used LSD.  Treatment Plan Summary:  1. Daily contact with patient to assess and evaluate symptoms and progress in treatment  2. Medication management  3. The patient will deny suicidal ideations or homicidal ideations for 48 hours prior to discharge and have a depression and anxiety rating of 3 or less. The patient will also deny any auditory or visual hallucinations or delusional thinking or display any manic or hypomanic behaviors.  4. The patient will display no evidence of substance withdrawal at time of discharge.   Plan:  1. Will continue the patient's current medications as ordered.  2. Will increase the medication Celexa to 40 mgs po q am to further address his depressive symptoms.  3. Will increase the medication Risperdal to 2 mgs po qhs to further address his psychosis. 4. Will change the medication Trazodone to 100 mgs po qhs instead of prn to help address the patient's sleep difficulties.  5. Laboratory studies reviewed. 6. TSH, Free T3, Free T4, Lipid Panel and HGB-A1C was ordered to further assess the patient's metabolic status.  7. Will continue to monitor.   Keylin Podolsky 07/02/2011, 2:10 PM

## 2011-07-02 NOTE — Progress Notes (Signed)
Patient ID: Jesse Mayer, male   DOB: 16-Jul-1970, 41 y.o.   MRN: [id number]   Patient has a flat affect on approach today. Still reports continued depression and hopelessness at a "9". Currently has SI on and off but contracts. Does report ringing in ears but does not voice any auditory hallucinations. Reports sleeping ok. Staff will monitor and encourage group attendance.

## 2011-07-02 NOTE — Progress Notes (Signed)
07/02/2011         Time: 0930      Group Topic/Focus: The focus of this group is on discussing various styles of communication and communicating assertively using 'I' (feeling) statements.  Participation Level: Active  Participation Quality: Appropriate and Attentive  Affect: Appropriate  Cognitive: Oriented   Additional Comments: None.   Jesse Mayer 07/02/2011 1:50 PM 

## 2011-07-02 NOTE — Discharge Planning (Signed)
Pt reported that his sleep and appetite were improving at this time. Pt continues to exhibit depression and rates his depression as a 9 on today versus yesterday with a depression score of 10. Pt reports that he continues to have suicidal ideations on and off. Pt is positive for ah/vh at this time. Pt is able to contract for safety at this time. Pt currently rates feeling hopelessness at a 9. Pt verbalized that he is interested in finding a therapist and a psychiatrist near [facility]. This Clinical research associate was able to contact an agency by the name of [facility] [phone]), who was able to accept pt's information and also offers therapy services as well as medication management. This writer was able to leave a voice message to schedule an appointment for pt.

## 2011-07-03 LAB — LIPID PANEL
HDL: 33 mg/dL — ABNORMAL LOW (ref >39)
LDL Cholesterol: 128 mg/dL — ABNORMAL HIGH (ref 0–99)
Total CHOL/HDL Ratio: 5.8 RATIO
Triglycerides: 150 mg/dL — ABNORMAL HIGH (ref <150)

## 2011-07-03 LAB — HEMOGLOBIN A1C: Hgb A1c MFr Bld: 5.6 % (ref <5.7)

## 2011-07-03 MED ORDER — TRAZODONE HCL 100 MG PO TABS
100.0000 mg | ORAL_TABLET | Freq: Every evening | ORAL | Status: DC | PRN
  Administered 2011-07-04: 100 mg via ORAL
  Filled 2011-07-03: qty 1

## 2011-07-03 MED ORDER — HYDROXYZINE HCL 50 MG PO TABS
50.0000 mg | ORAL_TABLET | Freq: Every day | ORAL | Status: DC
Start: 2011-07-03 — End: 2011-07-05
  Administered 2011-07-03 – 2011-07-04 (×2): 50 mg via ORAL
  Filled 2011-07-03 (×3): qty 1

## 2011-07-03 NOTE — Progress Notes (Signed)
[facility] Group Notes:  (Counselor/Nursing/MHT/Case Management/Adjunct)  07/03/2011 1:53 PM  Type of Therapy:  Group Therapy  Participation Level:  Active  Participation Quality:  Attentive and Sharing  Affect:  Depressed  Cognitive:  Appropriate and Oriented  Insight:  Good  Engagement in Group:  Good  Engagement in Therapy:  Good  Modes of Intervention:  Clarification, Education, Problem-solving and Support  Summary of Progress/Problems: Patient reported that he is doing better-sleeping better, improved appetite and mood is improving. Talked about stopping his medications with advice from doctor and therapist because he was doing well. He was off medications 1 year but gradually got depressed. He stated that he was withdrawing and that family was aware. He talked about tendency to push himself to be a good husband, father, etc. Talked about ways to support himself more positively. Also talked about his tendency to shut down but has been working on this and shares things with his father and others. Talked about his daughter also being an Forensic psychologist.   Yacine Droz, Aram Beecham 07/03/2011, 1:53 PM

## 2011-07-03 NOTE — Progress Notes (Signed)
Patient ID: Jesse Mayer, male   DOB: 03-Sep-1970, 41 y.o.   MRN: [id number] Niguel is fully alert and pleasant on approach. He has a blunt affect, polite and congenial. He reports that he fell asleep fairly easily only after receiving 50 mg of Vistaril last night. He he then awoke at 4 AM. Says that he slept very well the first night but does not know what medication received.  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 He reports his auditory hallucinations are gradually fading away. He rates his depression a 7/10 on a 1-10 scale of 10 is the worst symptoms. He rates his hopelessness as 7-8/10, and his anxiety as 4/10 today. He reports that all the symptoms are improved over how he felt yesterday. He denies any medication side effects. He says his appetite is gradually improving.  He reports a history of significant weight gain after starting antipsychotic medication 2 years ago. He then stopped it about a year ago. But the weight stuck with him. He is agreeable to speaking with a nutritionist. He has also been diagnosed with hypertension, but does not have a primary care physician and he would like to schedule his own appointments. I've given him instructions about available physicians in his area.  He denies any suicidal thoughts today. Denies homicidal thoughts.  Objective: Fully alert obese male, appears in no distress. Good eye contact, blunt affect. Pleasant and cooperative. He believes he is making progress of the medications and that is working well for him. His wife is supportive and has been here everyday to visit him. He plans on going back home and going to work. Wants to followup with family medicine in [place], [place]. Thinking is logical and goal directed. He does not appear to be internally distracted. Insight is partial. He voices his intent to continue medications. Denies any family history of psychosis or mental illness.  Total  cholesterol 190. TSH normal.  K+ 4.0.  Plan: We'll start him on scheduled Vistaril 50 mg by mouth each bedtime and make his trazodone 100 mg when necessary insomnia. Nutrition consult ordered.

## 2011-07-03 NOTE — Progress Notes (Signed)
Has been in bed most of the shift. Had to be awoken to take HS meds, but awoke spontaneously to name. Appears blunted and depressed. Calm and subdued during assessment. No acute distress. Stated he feels like he is improving. States he went to groups today. When asked which one he enjoyed the most, he stated he liked the one with the "psychiatrist" where they talked about weight gain with psych meds. States he is concerned with that issue for himself. Support and encouragement provided. Did have some questions about new medication schedule. Writer reviewed changes and he verbalized understanding of the changes. Otherwise offered no questions or concerns. Denies SI/HI/AVH and contracts for safety. Denies pain or discomfort. POC and medications for the shift reviewed and understanding verbalized. Safety has been maintained with Q15 minute observation. Will continue current POC

## 2011-07-03 NOTE — Discharge Planning (Signed)
Met with patient in Aftercare Planning Group.   He presented with flat affect and depressed mood, looking same as yesterday, but reported feeling "a little better" than yesterday.  He again declined assistance with making his workplace aware of hospitalization, but does ask for a letter at discharge.  Case Manager informed patient that process is underway to find him a new med mgmt/therapy agency.    Utilization review done for additional days.  Ambrose Mantle, LCSW 07/03/2011, 9:46 AM

## 2011-07-03 NOTE — Progress Notes (Addendum)
Patient up and participating in groups today, but quiet.  Stays to himself when nothing else is going on.  Tolerating medications well.  Call from his wife today stating that HR has been trying to contact him today regarding his work status.  I gave the number to the patient and to [employer].  Patient denies depression, hopelessness, or suicidal ideations at this time.

## 2011-07-04 NOTE — Plan of Care (Signed)
Problem: Food- and Nutrition-Related Knowledge Deficit (NB-1.1) Goal: Nutrition education Formal process to instruct or train a patient/client in a skill or to impart knowledge to help patients/clients voluntarily manage or modify food choices and eating behavior to maintain or improve health.  Outcome: Completed/Met Date Met:  07/04/11 Knowledge deficit resolved with diet education on 4/10.   Comments:  Jesse Mayer is a 41 year old male, newly diagnosed with hypertension. Patient reported weight gain due to medications, but a recent weight loss of approximately 16 lb. Over the past month. He reported a good appetite and to eat regularly. He does not follow any special diet. He enjoys reading and watching tv.   Weight status: He reported a UBW of 370 lb. Current weight 354 lb..     95.6% of UBW.  Height: 76" BMI: 43 kg/m^2 (Morbid Obesity)  Discussed and provided handouts obtained from the ADA nutrition care manual, "Healthy Nutrition" and "Low-sodium, Low-cholesterol nutrition therapy". Patient with good, positive input. He reported he usually grills or cooks meat in a pan. He reported he drinks whole milk, sodas and tea sometimes. Encouraged patient to avoid fried foods and to consume low calorie beverages. He reported that he sometimes tracks caloric intake on an app in his phone. Encouraged patient to continue to keep track when able. Patient without any further nutrition related question. He verbalized understanding of nutrition material. Expect fair compliance.  RD available for nutrition needs.  [name] Pager [phone]

## 2011-07-04 NOTE — Progress Notes (Signed)
Adult Services Patient-Family Contact/Session  Attendees:  [name] wife, Efraim Kaufmann [phone])  Goal(s):  Discharge planning  Safety Concerns:  None reported  Narrative: Talked with wife about possible discharge on Friday if patient continues to improve. Wife thought he was doing better based on her visits and calls to him. She questioned how long  he would need to take medications when he leaves since he had gotten depressed again. Advised her that even though his previous doctor and therapist had given him permission to go off medications, it was advised that he continue medications when he is discharged. He has responded well to medications and patient admits to feeling better because of them. Reported to her that symptoms have decreased since starting back on medications. Talked about his issues regarding waking up early and not being able to sleep. Reported to her that doctor is addressing this. Discussed his depression leading up to hospitalization She stated that she was not aware of the problem until about 1 week ago, however patient reported in group that it had been going on for some time and he had not disclosed with his family. Counselor emphasized need for there to be some supervision of patient when he leaves due to suicide risk until his mood has stabilized and she feels comfortable about his safety. Discussed that they both have the same work schedule and she would be with him in the evening. Not sure when patient will return to work. Patient is hesitant about letting work know too much or who to let know. Wife has tried to handle this and has a note from the ED through the 15th of April. Patient and treatment team will make a decision about when it would be appropriate for patient to return to work. Wife could only identify work stress as a problem for patient. She stated that he had started a new position about 4 months ago. She stated that patient is very hard on himself. Discussed  outpatient follow up. Reported to wife that patient wants something in [place] instead of [place] and he will be working with case manager for follow up appointments. Therapy is recommended as well as psychiatrist to monitor medications.  Barrier(s):  None  Interventions:  Discharge planning, review of safety issues and recommendation for supervision after patient is discharged, education, support for wife  Recommendation(s):  Outpatient follow up. Supervision of patient for safety.  Follow-up Required:  No  Explanation:    Veto Kemps 07/04/2011, 2:18 PM

## 2011-07-04 NOTE — Progress Notes (Signed)
Pt has been up today, has been participating in various activities, has endorsed depression, pt giving very little eye contact, pt has not verbalized any complaints, stated that he needs to take his medications as prescribed, will continue to monitor

## 2011-07-04 NOTE — Discharge Planning (Signed)
Met with patient in Aftercare Planning Group.   Affect remains blunted and mood depressed, although less so than yesterday.  Patient reported that he slept better last night, and that he has no suicidal ideation today.  He also states he is currently not experiencing any voices.  His depression continues to rate at 5 out of 10.  We discussed the efforts being made to find a follow-up provider and he expressed that he really wants a provider in [place] not [place].  Case Manager did utilization review for additional days.  Ambrose Mantle, LCSW 07/04/2011, 3:37 PM

## 2011-07-04 NOTE — Progress Notes (Signed)
Currently resting quietly in bed in right lateral position with eyes closed. Respirations are even and unlabored. No acute distress noted. Safety has been maintained with Q15 minute observation. Will continue current POC.  

## 2011-07-05 MED ORDER — HYDROXYZINE HCL 50 MG PO TABS
100.0000 mg | ORAL_TABLET | Freq: Every day | ORAL | Status: DC
  Administered 2011-07-05: 100 mg via ORAL
  Filled 2011-07-05 (×2): qty 2

## 2011-07-05 NOTE — Progress Notes (Signed)
[facility] Group Notes:  (Counselor/Nursing/MHT/Case Management/Adjunct)  07/05/2011 3:51 PM  Type of Therapy:  Group Therapy  Participation Level:  Did Not Attend   Jesse Mayer 07/05/2011, 3:51 PM

## 2011-07-05 NOTE — Progress Notes (Signed)
[facility] Group Notes:  (Counselor/Nursing/MHT/Case Management/Adjunct)  07/05/2011 3:52 PM  Type of Therapy:  Music Therapy  Participation Level:  Active  Participation Quality:  Appropriate  Affect:  Depressed  Cognitive:  Appropriate  Insight:  Good  Engagement in Group:  Good  Engagement in Therapy:  Good  Modes of Intervention:  Activity, Education, Support and music  Summary of Progress/Problems: Patient participated in music relaxation activities. Stated that he uses music at work to help reduce his stress. Found music in group to be helpful. Had memories of mother gardening and this was a pleasant scene for him.   HartisAram Mayer 07/05/2011, 3:52 PM

## 2011-07-05 NOTE — Progress Notes (Signed)
Patient ID: Jesse Mayer, male   DOB: 1970-11-07, 41 y.o.   MRN: [id number]   Patient pleasant on approach this am. States that his mood has improved a lot since first admitted. Rates depression and feelings of hopelessness at a "3" on scale. Currently denies any SI, HI or a/v hallucinations. Feels that the medications are working for him. States that he thinks that he may be discharged tomorrow. Staff will monitor and encourage group attendance.

## 2011-07-05 NOTE — Progress Notes (Signed)
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  Generalized Anxiety Disorder.   The patient was seen today and reports the following:   Sleep: The patient reports to having some difficulty maintaining sleep last night and asked for the medication Vistaril to be given at 100 mgs at night.  Appetite: The patient reports a good appetite today.   Mild>(1-10) >Severe  Hopelessness (1-10): 5  Depression (1-10): 5  Anxiety (1-10): 2   Suicidal Ideation: The patient denies any current suicidal or homicidal ideations.  Plan: No  Intent: No  Means: No   Homicidal Ideation: The patient denies any homicidal ideations today.  Plan: No  Intent: No.  Means: No   Eye Contact: Good.  General Appearance/Behavior: The patient remained friendly and cooperative during the interview today.  Motor Behavior: wnl.  Speech: Appropriate in rate and volume with no pressuring of speech today.  Mental Status: Alert and Oriented x 3  Level of Consciousness: Alert  Mood: Moderately Depressed.  Affect: Moderately Constricted.  Anxiety: Mild anxiety reported today.  Thought Process: wnl.  Thought Content: The patient denies any auditory or visual hallucinations today as well as denies any delusional thinking. Perception: wnl.  Judgment: Good.  Insight: Good.  Cognition: Orientated to person, place and time.  Sleep:  Number of Hours: 5.75    Vital Signs:Blood pressure 138/80, pulse 125, temperature 97 F (36.1 C), temperature source Oral, resp. rate 20, height 6\' 4"  (1.93 m), weight 160.573 kg (354 lb).  Current Medications: Current Facility-Administered Medications  Medication Dose Route Frequency Provider Last Rate Last Dose  . acetaminophen (TYLENOL) tablet 650 mg  650 mg Oral Q6H PRN Larena Sox, MD      . alum & mag hydroxide-simeth (MAALOX/MYLANTA) 200-200-20 MG/5ML suspension 30 mL  30 mL Oral Q4H PRN Larena Sox, MD      . citalopram (CELEXA) tablet 40 mg  40 mg Oral Daily Curlene Labrum Bassem Bernasconi, MD   40 mg at 07/05/11 0813  . hydrochlorothiazide (MICROZIDE) capsule 12.5 mg  12.5 mg Oral Daily Curlene Labrum Zyon Rosser, MD   12.5 mg at 07/05/11 0813  . hydrOXYzine (ATARAX/VISTARIL) tablet 50 mg  50 mg Oral QHS Viviann Spare, NP   50 mg at 07/04/11 2135  . lisinopril (PRINIVIL,ZESTRIL) tablet 20 mg  20 mg Oral Q breakfast Curlene Labrum Kyan Yurkovich, MD   20 mg at 07/05/11 0813  . magnesium hydroxide (MILK OF MAGNESIA) suspension 30 mL  30 mL Oral Daily PRN Larena Sox, MD      . nicotine (NICODERM CQ - dosed in mg/24 hours) patch 21 mg  21 mg Transdermal Q0600 Curlene Labrum Melanie Pellot, MD   21 mg at 07/05/11 0612  . risperiDONE (RISPERDAL M-TABS) disintegrating tablet 0.5 mg  0.5 mg Oral Q6H PRN Mickie D. Adams, PA      . risperiDONE (RISPERDAL) tablet 2 mg  2 mg Oral QHS Curlene Labrum Asalee Barrette, MD   2 mg at 07/04/11 2135  . traZODone (DESYREL) tablet 100 mg  100 mg Oral QHS PRN Viviann Spare, NP   100 mg at 07/04/11 2244   Lab Results: No results found for this or any previous visit (from the past 48 hour(s)).  Time was spent today discussing with the patient his current symptoms. The patient reports to having difficulty maintaining sleep.  He reports a good appetite as well as moderate depressive symptoms.  He denies any SI/HI today as well as any current auditory or visual hallucinations or delusional thinking.  The patient states that he believes he once was given 100 mgs of Vistaril for sleep and does not feel 50 mgs which is currently ordered is enough.  He asked today if this could be ordered.  Some time was spent discussing this with the patient and it was decided to give the patient one more night prior to further increasing the Vistaril.  Treatment Plan Summary:  1. Daily contact with patient to assess and evaluate symptoms and progress in treatment  2. Medication management  3. The patient will deny suicidal  ideations or homicidal ideations for 48 hours prior to discharge and have a depression and anxiety rating of 3 or less. The patient will also deny any auditory or visual hallucinations or delusional thinking or display any manic or hypomanic behaviors.  4. The patient will display no evidence of substance withdrawal at time of discharge.   Plan:  1. Will continue the patient's current medications as ordered.  2. Will consider increasing the medication Vistaril to 100 mgs po qhs tomorrow if the patient does not sleep well tonight.  3. Laboratory studies reviewed.  4. Will continue to monitor.   Fotini Lemus 07/05/2011, 11:42 AM

## 2011-07-05 NOTE — Progress Notes (Signed)
[facility] Group Notes:  (Counselor/Nursing/MHT/Case Management/Adjunct)  07/05/2011 8:59 AM  Type of Therapy:  Group Therapy  Participation Level:  Active  Participation Quality:  Attentive and Sharing  Affect:  Depressed  Cognitive:  Appropriate  Insight:  Good  Engagement in Group:  Good  Engagement in Therapy:  Good  Modes of Intervention:  Education, Problem-solving, Support and Exploration  Summary of Progress/Problems: Patient stated that he can tell that he has improved with not hearing voices, sleeping better and mood has improved. He anticipates being discharged on Friday. Not sure about when he will return to work. Talked about getting more involved with family activities, and stated that he had just been working and isolating to his room. He had not been keeping up with regular activities.   Grover Woodfield, Aram Beecham 07/05/2011, 8:59 AM

## 2011-07-05 NOTE — Progress Notes (Signed)
[facility] Group Notes:  (Counselor/Nursing/MHT/Case Management/Adjunct)  07/05/2011 9:01 AM  Type of Therapy:  Psychoeducational Skills  Participation Level:  Active  Participation Quality:  Attentive   Summary of Progress/Problems: Patient was attentive to speaker from mental health association.   Jesse Mayer, Aram Beecham 07/05/2011, 9:01 AM

## 2011-07-05 NOTE — Discharge Planning (Signed)
Met with patient in Aftercare Planning Group.   He talked at length about his hope to return to work on Monday.  States he feels that he has pretty much mastered the job now, after some initial struggles and being a little overwhelmed.  His plan to return to work on Monday, if okay with doctor, would facilitate the safety plan discussed by counselor with wife, as he would be at work during the day and then with her and children during the evening.  Patient stated that he plans to "force" himself to participate more in the activities with wife and children, and Case Manager challenged him to come up with a plan of HOW he will do this.  Group discussed at length that one of the symptoms of many mental illnesses is that it is difficult to think of how to address an issue while that issue is happening, so one must have pre-planned what steps to take.  Patient agreed with this and agreed to consider and come up with a specific plan of action.  Left a third message at [facility] requesting a new patient intake appointment.  Ambrose Mantle, LCSW 07/05/2011, 9:54 AM

## 2011-07-05 NOTE — Progress Notes (Signed)
Patient ID: Jesse Mayer, male   DOB: [birth year], 41 y.o.   MRN: [id number] Lora presents pleasant on approach. Appropriately groomed, he is complaining that he did not get much sleep last night, partially because of environmental reasons. Also having significant problems initiating and maintaining sleep. He does not get drowsy very easily, and once he does fall asleep he awakens again at 2:30 AM. He feels that he did better when he took more Vistaril.  He reports his hopelessness 3/10 on a 1-10 scale if 10 is the worst symptoms, he reports depression 3/10 on a 1-10 scale. He denies anxiety or any sense of tension. He denies any auditory hallucinations denies paranoia. His blood pressure is 130/80, he is mildly tachycardic at 125.  Objective: Fully alert male, pleasant, cooperative, with a blunted affect. He is anticipating going home tomorrow. Mood mildly depressed, and affect congruent. Speech normal in pace, tone, and production. Insight fairly good. Judgment appropriate. No dangerous thoughts. Concerned about his sleep. He does not appear internally distracted no evidence of psychosis.  Assessment: MDD with psychotic features, generalized anxiety disorder.  Plan:  I've instructed him to discontinue his nicotine patch. This may be contributing to insomnia. We'll increase his Vistaril for 100 mg each bedtime. Consider discharge in the morning.

## 2011-07-06 MED ORDER — HYDROCHLOROTHIAZIDE 12.5 MG PO CAPS: 12.5000 mg | ORAL_CAPSULE | Freq: Every day | ORAL | Status: DC

## 2011-07-06 MED ORDER — HYDROXYZINE HCL 50 MG PO TABS: 100.0000 mg | ORAL_TABLET | Freq: Every day | ORAL | Status: AC

## 2011-07-06 MED ORDER — CITALOPRAM HYDROBROMIDE 40 MG PO TABS: 40.0000 mg | ORAL_TABLET | Freq: Every day | ORAL | Status: DC

## 2011-07-06 MED ORDER — LISINOPRIL 20 MG PO TABS: 20.0000 mg | ORAL_TABLET | Freq: Two times a day (BID) | ORAL | Status: DC

## 2011-07-06 MED ORDER — RISPERIDONE 2 MG PO TABS: 2.0000 mg | ORAL_TABLET | Freq: Every day | ORAL | Status: DC

## 2011-07-06 NOTE — Progress Notes (Signed)
[facility] Case Management Discharge Plan:  Will you be returning to the same living situation after discharge: Yes,  with wife and children At discharge, do you have transportation home?:Yes,  with wife Do you have the ability to pay for your medications:Yes,  insurance and income  Interagency Information:     Release of information consent forms completed and in the chart;  [name] signature needed at discharge.  Patient to Follow up at:  Follow-up Information    Follow up with Arley Phenix for therapy on 07/17/2011. (2:45PM appointment - Take NEW PATIENT PACKET with you)    Contact information:   [facility] office [address] [place]  [address] Telephone:  [phone]      Follow up with Dr. Kathryne Sharper for medication management on 07/19/2011. (11:45AM appointment)    Contact information:   [facility] [place] office [address] [place]  [address] Telephone:  [phone]         Patient denies SI/HI:   Yes,      Safety Planning and Suicide Prevention discussed:  Yes,  During Aftercare Planning Group, Case Manager provided psychoeducation on "Suicide Prevention Information."  This included descriptions of risk factors for suicide, warning signs that an individual is in crisis and thinking of suicide, and what to do if this occurs.  Pt indicated understanding of information provided, and will read brochure given upon discharge.     Barrier to discharge identified:No.  Summary and Recommendations:  Follow up with outpatient appointments.   Sarina Ser 07/06/2011, 2:09 PM

## 2011-07-06 NOTE — [facility] Suicide Risk Assessment (Signed)
Suicide Risk Assessment  Discharge Assessment     Demographic factors:  Male;Low socioeconomic status  Current Mental Status Per Nursing Assessment::   On Admission:   (denied SI & HI, contracts for safety) At Discharge:  AO x 3.  The patient denies any auditory or visual hallucinations or delusional thinking.  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  Generalized Anxiety Disorder.   The patient was seen today and reports the following:   Sleep: The patient reports to sleeping well last night after the medication Vistaril was increased.  Appetite: The patient reports a good appetite today.   Mild>(1-10) >Severe  Hopelessness (1-10): 2-3  Depression (1-10): 3  Anxiety (1-10): 1-2   Suicidal Ideation: The patient adamantly denies any current suicidal or homicidal ideations.  Plan: No  Intent: No  Means: No   Homicidal Ideation: The patient adamantly denies any homicidal ideations today.  Plan: No  Intent: No.  Means: No   Eye Contact: Good.  General Appearance/Behavior: The patient remained friendly and cooperative during the interview today.  Motor Behavior: wnl.  Speech: Appropriate in rate and volume with no pressuring of speech today.  Mental Status: Alert and Oriented x 3  Level of Consciousness: Alert  Mood: Mildly depressed.  Affect: Mildly constricted.  Anxiety: Mild anxiety reported today.  Thought Process: wnl.  Thought Content: The patient denies any auditory or visual hallucinations today as well as denies any delusional thinking.  Perception: wnl.  Judgment: Good.  Insight: Good.  Cognition: Orientated to person, place and time.   Loss Factors: Legal issues;Financial problems / change in socioeconomic status  Historical Factors: Family history of suicide;Family history of mental illness or substance abuse;Domestic violence in family of origin  Risk Reduction Factors:    Good Family Support.  Gainful Employment.  Good Access to Patient Care.  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    Generalized Anxiety Disorder.  AXIS II:   Deferred. AXIS III: 1. Hypertension.   AXIS IV:   Chronic Mental Illness. AXIS V:   GAF at admission approximately 40.  GAF at discharge approximately 60.  Cognitive Features That Contribute To Risk:  None Noted.   Time was spent today discussing with the patient his current symptoms. The patient reports to sleeping much better with the medication Vistaril being increased.  He reports mild feelings of sadness, anhedonia and depressed mood and denies any suicidal or homicidal ideations.  The patient also denies any auditory or visual hallucinations and reports mild anxiety symptoms.  The patient states that he feels ready for discharge and this will be ordered today.  Treatment Plan Summary:  1. Daily contact with patient to assess and evaluate symptoms and progress in treatment  2. Medication management  3. The patient will deny suicidal ideations or homicidal ideations for 48 hours prior to discharge and have a depression and anxiety rating of 3 or less. The patient will also deny any auditory or visual hallucinations or delusional thinking or display any manic or hypomanic behaviors.  4. The patient will display no evidence of substance withdrawal at time of discharge.   Plan:  1. Will continue the patient's current medications as ordered.  2. Laboratory studies reviewed.  3. Will continue to monitor.  4. The patient will  be discharged to outpatient follow up as requested.  Suicide Risk:  Minimal: No identifiable suicidal ideation.  Patients presenting with no risk factors but with morbid ruminations; may be classified as minimal risk based on the severity of the  depressive symptoms  Plan Of Care/Follow-up recommendations:  Activity:  As tolerated. Diet:  Heart Healthy Diet. Other:  Please keep all scheduled follow up appointments and take all medications only as directed.  Jesse Mayer 07/06/2011, 9:38 AM

## 2011-07-06 NOTE — Progress Notes (Signed)
07/06/2011         Time: 1130      Group Topic/Focus: The focus of the group is on enhancing the patients' ability to cope with stressors by understanding what coping is, why it is important, the negative effects of stress and developing healthier coping skills. Patients practice [place] and discuss how exercise can be used as a healthy coping strategy.  Participation Level: Active  Participation Quality: Attentive  Affect: Appropriate  Cognitive: Oriented   Additional Comments: Patient bright, looking forward to discharge today.   Jesse Mayer 07/06/2011 1:05 PM

## 2011-07-06 NOTE — Progress Notes (Signed)
Patient ID: Jesse Mayer, male   DOB: [birth year], 41 y.o.   MRN: [id number] Pt discharged to wife at this time, pt denies any suicidal thoughts, pt provided with supply of medications and prescriptions, pt provided with discharge instructions and pt verbalized understanding, all belongings were returned

## 2011-07-06 NOTE — Progress Notes (Signed)
[facility] Group Notes:  (Counselor/Nursing/MHT/Case Management/Adjunct)  07/06/2011 11:33 AM  Type of Therapy:  Group Therapy  Participation Level:  Did Not Attend   Summary of Progress/Problems: Patient is planning on attending groups on 500 unit.   HartisAram Mayer 07/06/2011, 11:33 AM

## 2011-07-06 NOTE — Progress Notes (Signed)
Lying quietly in bed with eyes closed.  No physical complaints or behavioral issues reported or observed.  Q 15 minute safety checks in progress.

## 2011-07-06 NOTE — Tx Team (Signed)
Interdisciplinary Treatment Plan Update (Adult)  Date:  07/06/2011  Time Reviewed:  10:15AM-11:00AM  Progress in Treatment: Attending groups:  Yes Participating in groups:    Yes Taking medication as prescribed:    Yes Tolerating medication:   Yes Family/Significant other contact made:  Yes Patient understands diagnosis:   Yes Discussing patient identified problems/goals with staff:   Yes Medical problems stabilized or resolved:   Yes Denies suicidal/homicidal ideation: Yes  Issues/concerns per patient self-inventory:   None Other:    New problem(s) identified: No, Describe:    Reason for Continuation of Hospitalization: None  Interventions implemented related to continuation of hospitalization:  Medication monitoring and adjustment, safety checks Q15 min., suicide risk assessment, group therapy, psychoeducation, collateral contact, aftercare planning, ongoing physician assessments, medication education - UNTIL DISCHARGE  Additional comments:  Not applicable  Estimated length of stay:  Discharge today to go home with family, follow-up is arranged with [facility] in [place] for therapy and med mgmt  Discharge Plan:  Return home with family,   New goal(s):  Not applicable  Review of initial/current patient goals per problem list:   1.  Goal(s):  Deny SI for 48 hours prior to D/C.  Met:  Yes  Target date:  By Discharge   As evidenced by:  Has been denying for several days  2.  Goal(s):  Secure aftercare of therapist/psychiatrist for patient, as he is new to area.  Met:  Yes  Target date:  By Discharge   As evidenced by:  Arranged with [facility]  3.  Goal(s):  Reduce auditory hallucinations to baseline, and report command AH no longer telling him to hurt self or others.  Met:  Yes  Target date:  By Discharge   As evidenced by:  Denies voices  4.  Goal(s):  Medication stabilization.  Met:  Yes  Target date:  By Discharge   As  evidenced by:  Meds are at effective dosage  5.  Goal(s):  Reduce depression, anxiety and hopelessness from admission levels of 10, 5, and 9 to no more than 3 at discharge.  Met: Yes  Target date:  By Discharge   As evidenced by:  3, 2, and 3 today  Attendees: Patient:  Jesse Mayer  07/06/2011 10:15AM-11:00AM  Family:     Physician:  Dr. Harvie Heck Readling 07/06/2011 10:15AM-11:00AM  Nursing:   Tacy Learn, RN 07/06/2011 10:15AM -11:00AM   Case Manager:  Ambrose Mantle, LCSW 07/06/2011 10:15AM-11:00AM  Counselor:  Veto Kemps, MT-BC 07/06/2011 10:15AM-11:00AM  Other:   Omelia Blackwater, RN 07/06/2011 10:15AM-11:00AM  Other:      Other:      Other:       Scribe for Treatment Team:   Sarina Ser, 07/06/2011, 10:15AM-11:15AM

## 2011-07-09 NOTE — Discharge Summary (Signed)
Physician Discharge Summary Note  Patient:  Jesse Mayer is an 41 y.o., male MRN:  [id number] DOB:  1970-04-22 Patient phone:  [phone] (home)  Patient address:   [address] [place] [address]  Date of Admission:  06/30/2011 Date of Discharge: 07/06/2011  Reason for Admission:  Discharge Diagnoses: Principal Problem:  *Major depressive disorder, recurrent episode, severe, with psychosis Active Problems:  Generalized anxiety disorder   Axis Diagnosis:   AXIS I:  Major Depressive Disorder, Recurrent, Severe, with psychosis. AXIS II:  No Diagnosis AXIS III:  Hypertension Past Medical History  Diagnosis Date  . Hypertension   . Anxiety   . Depression    AXIS IV:  Supportive family is an asset AXIS V:  61-70 mild symptoms  Level of Care:  OP  Hospital Course:  First admission for [facility] who presented with an exacerbation of his depression and having auditory hallucinations with voices telling him that he should go ahead and drink alcohol. There were also telling him that he has not worth anything and he should just go ahead and kill himself.  He reported a previous, similar episode for which was hospitalized in [place] 2 years ago. Onset of symptoms occurred after he had experimented with LSD. Was previously successful Celexa and Risperdal. He presented well-organized, polite, and cooperative, rating his hopelessness and 9/10 on a scale of 1-10 with 10 being worst symptoms. Depression rated 9/10, anxiety 5/10.  He was admitted for acute stabilization unit and he was appropriate cooperative with staff and peers. Group therapy was productive. Celexa and Risperdal we'll restart his, and he tolerated them well. No evidence of EPS or other side effects. Gradually his depression lessened, his affect broadened. Throughout his stay his wife visited regularly and was supportive. He plans on returning to work after discharge  We continued his routine  hypertension medications. And he declined our offer to schedule medical followup appointments for him, preferring to do this himself. His blood pressure was stable on the unit though mildly elevated.  Consults:  None  Significant Diagnostic Studies:  Total cholesterol 191, LDL 128, triglycerides 150. TSH 0.745.  Free T4-1.20.  Discharge Vitals:   Blood pressure 149/101, pulse 109, temperature 97.5 F (36.4 C), temperature source Oral, resp. rate 20, height 6\' 4"  (1.93 m), weight 160.573 kg (354 lb).  Mental Status Exam: See Mental Status Examination and Suicide Risk Assessment completed by Attending Physician prior to discharge.  Discharge destination:  Home  Is patient on multiple antipsychotic therapies at discharge:  No   Has Patient had three or more failed trials of antipsychotic monotherapy by history:  No  Recommended Plan for Multiple Antipsychotic Therapies: N/a/   Medication List  As of 07/09/2011  2:49 PM   TAKE these medications      Indication    citalopram 40 MG tablet   Commonly known as: CELEXA   Take 1 tablet (40 mg total) by mouth daily. For depression.       hydrochlorothiazide 12.5 MG capsule   Commonly known as: MICROZIDE   Take 1 capsule (12.5 mg total) by mouth daily. For blood pressure.       hydrOXYzine 50 MG tablet   Commonly known as: ATARAX/VISTARIL   Take 2 tablets (100 mg total) by mouth at bedtime. For sleep       lisinopril 20 MG tablet   Commonly known as: PRINIVIL,ZESTRIL   Take 1 tablet (20 mg total) by mouth 2 (two) times daily. For blood  pressure       risperiDONE 2 MG tablet   Commonly known as: RISPERDAL   Take 1 tablet (2 mg total) by mouth at bedtime. For auditory hallucinations.            Follow-up Information    Follow up with Arley [facility]ix for therapy on 07/17/2011. (2:45PM appointment - Take NEW PATIENT PACKET with you)    Contact information:   [facility] office [address]  [place]  [address] Telephone:  [phone]      Follow up with Dr. Kathryne Sharper for medication management on 07/19/2011. (11:45AM appointment)    Contact information:   [facility] office [address] [place]  [address] Telephone:  [phone]         Follow-up recommendations:  Activity:  unrestricted Diet:  regular  Signed: Britnee Mcdevitt A 07/09/2011, 2:49 PM

## 2011-07-10 NOTE — Progress Notes (Signed)
Patient Discharge Instructions:  Psychiatric Admission Assessment Note Provided,  07/10/2011 After Visit Summary (AVS) Provided,  07/10/2011 Face Sheet Provided, 07/10/2011 Faxed/Sent to the Next Level Care provider:  07/10/2011 Provided Suicide Risk Assessment - Discharge Assessment 07/10/2011  Records made available to [facility] - Arley Phenix and Dr. Lolly Mustache via [facility]/Epic access.   Wandra Scot, 07/10/2011, 5:05 PM

## 2011-07-17 ENCOUNTER — Ambulatory Visit (INDEPENDENT_AMBULATORY_CARE_PROVIDER_SITE_OTHER): Payer: BC Managed Care – PPO | Admitting: Psychology

## 2011-07-17 DIAGNOSIS — F331 Major depressive disorder, recurrent, moderate: Secondary | ICD-10-CM

## 2011-07-17 DIAGNOSIS — F411 Generalized anxiety disorder: Secondary | ICD-10-CM

## 2011-07-17 DIAGNOSIS — F4001 Agoraphobia with panic disorder: Secondary | ICD-10-CM

## 2011-07-19 ENCOUNTER — Encounter (HOSPITAL_COMMUNITY): Payer: Self-pay | Admitting: Psychology

## 2011-07-19 ENCOUNTER — Ambulatory Visit (INDEPENDENT_AMBULATORY_CARE_PROVIDER_SITE_OTHER): Payer: BC Managed Care – PPO | Admitting: Psychiatry

## 2011-07-19 ENCOUNTER — Encounter (HOSPITAL_COMMUNITY): Payer: Self-pay | Admitting: *Deleted

## 2011-07-19 ENCOUNTER — Encounter (HOSPITAL_COMMUNITY): Payer: Self-pay | Admitting: Psychiatry

## 2011-07-19 VITALS — Wt 352.0 lb

## 2011-07-19 DIAGNOSIS — F329 Major depressive disorder, single episode, unspecified: Secondary | ICD-10-CM

## 2011-07-19 MED ORDER — HYDROXYZINE PAMOATE 100 MG PO CAPS: 100.0000 mg | ORAL_CAPSULE | Freq: Every day | ORAL | Status: AC

## 2011-07-19 MED ORDER — RISPERIDONE 2 MG PO TABS: 2.0000 mg | ORAL_TABLET | Freq: Every day | ORAL | Status: DC

## 2011-07-19 MED ORDER — CITALOPRAM HYDROBROMIDE 40 MG PO TABS: 40.0000 mg | ORAL_TABLET | Freq: Every day | ORAL | Status: DC

## 2011-07-19 NOTE — Progress Notes (Signed)
Patient:   Jesse Mayer   DOB:   1970/12/09  MR Number:  [id number]  Location:  [facility] [address] [place] [address] Dept: [phone]           Date of Service:   07/17/2011  Start Time:   3 PM End Time:   4:02 PM  Provider/Observer:  Hershal Coria PSYD       Billing Code/Service: [phone]  Chief Complaint:     Chief Complaint  Patient presents with  . Panic Attack  . Anxiety  . Depression    Reason for Service:  The patient was referred following an inpatient hospitalization. Maj. stressors include his work situation and his financial situations. The patient has a history of recurrent depression due to stress as well as underlying anxiety disorder with pronounced panic attacks in the past. The patient had some great difficulties in the past particularly after his divorce and moving to [place] and engaging in significant alcohol or drug abuse. He had significant panic attacks while under the influence of cocaine and LSD and subsequently moved back here after trying to stop these drug use. He continued to have depression as well as panic attacks and would also lead to paranoid types of ideation.  Current Status:  The patient has continued to have difficulties with depression and anxiety with subsequent panic attacks. These have persisted for quite some time. The patient reports that a couple of years ago he was living in [place] and separated from his wife. He began to self medicate for these conditions including regular and significant alcohol abuse and felt that he just simply "did not care." The patient reports that after doing some cocaine one night that he was with a group of friends and he took LSD. Almost immediately he started some strong hallucinations with visual and not reporting hallucinations and he was so terrified during this experience that he came to the strong  conclusion that he needed help with the situation. He moved back to this area and the symptoms continued for months. He reports he would become very panicked and feel like his life is in danger and would experience various triggers including seeing certain types of signs or other license plates. The patient reports that he did have some panic attacks prior to this during cocaine use and would remember what happened to the basketball star Len Bias who died of sudden death after cocaine use.  Reliability of Information: While this information was provided primarily by the patient himself it correlates with the medical record.  Behavioral Observation: Jesse Mayer  presents as a 41 y.o.-year-old Right African American Male who appeared his stated age. his dress was Appropriate and he was Well Groomed and his manners were Appropriate to the situation.  There were not any physical disabilities noted.  he displayed an appropriate level of cooperation and motivation.    Interactions:    Active   Attention:   within normal limits  Memory:   within normal limits  Visuo-spatial:   within normal limits  Speech (Volume):  normal  Speech:   normal pitch  Thought Process:  Coherent  Though Content:  WNL  Orientation:   person, place, time/date and situation  Judgment:   Fair  Planning:   Fair  Affect:    Anxious  Mood:    Depressed  Insight:   Good  Intelligence:   normal  Marital  Status/Living: The patient was born and raised in [place] and grew up in what he describes as a loving and caring household with good Christian values. The patient has returned to his only marriage and he continues to live with his wife and youngest 2 daughters. He has a 49 year old daughter, 87 year old daughter, and an 61-year-old daughter. The patient spends his leisure time watching TV and spending time with his family. His parents continue to live in [place] and he sees them often.  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  Current Employment: The patient is currently working for [employer] and has been there for the past 5 months. He reports he does enjoy his current job.  Past Employment:  The patient reports that he has had numerous types of jobs in the past but no specific focus. His wife works currently with hospice eval [employer]  Substance Use:  There is a documented history of alcohol, cocaine and marijuana abuse confirmed by the patient.    Education:   College  Medical History:   Past Medical History  Diagnosis Date  . Hypertension   . Anxiety   . Depression         Outpatient Encounter Prescriptions as of 07/17/2011  Medication Sig Dispense Refill  . citalopram (CELEXA) 40 MG tablet Take 1 tablet (40 mg total) by mouth daily. For depression.  30 tablet  0  . hydrochlorothiazide (MICROZIDE) 12.5 MG capsule Take 1 capsule (12.5 mg total) by mouth daily. For blood pressure.  30 capsule  0  . hydrOXYzine (ATARAX/VISTARIL) 50 MG tablet Take 2 tablets (100 mg total) by mouth at bedtime. For sleep  60 tablet  0  . lisinopril (PRINIVIL,ZESTRIL) 20 MG tablet Take 1 tablet (20 mg total) by mouth 2 (two) times daily. For blood pressure  60 tablet  0  . risperiDONE (RISPERDAL) 2 MG tablet Take 1 tablet (2 mg total) by mouth at bedtime. For auditory hallucinations.  30 tablet  0        Patient graduated from high school and has had 2 years of college education. He had particular town in sports but did acknowledge some behavioral problems in school.  Sexual History:   History  Sexual Activity  . Sexually Active: Yes    no birth control/no protection    Abuse/Trauma History: While the patient denies any history of abuse trauma he did have some very traumatic experiences while on combination of cocaine and LSD  Psychiatric History:  The patient has been treated in the past for psychiatric illness including  hallucinations and was seen at the mental health department in [place] about 2 years ago. He is taking trazodone, Celexa, and Risperdal in the past.  Family Med/Psych History: No family history on file.  Risk of Suicide/Violence: low   Impression/DX:  This point, the patient does have a long history of psychiatric illness that included significant episodes of depression, significant substance abuse, and panic attacks. He is also at times with hallucinations with some of them being times when he was not abusing various substances.  Disposition/Plan:  We will set the patient up for individual psychotherapeutic interventions and he also has an appointment with Dr. Lolly Mustache for followup psychiatric care.  Diagnosis:    Axis I:   1. Major depressive disorder, recurrent episode, moderate   2. 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 Generalized anxiety disorder  Axis II: Deferred       Axis III:  See body of report for medical issues      Axis IV:  economic problems, problems related to legal system/crime and problems related to social environment          Axis V:  51-60 moderate symptoms
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
## 2011-07-30 ENCOUNTER — Other Ambulatory Visit (HOSPITAL_COMMUNITY): Payer: Self-pay | Admitting: Psychiatry

## 2011-07-30 ENCOUNTER — Telehealth (HOSPITAL_COMMUNITY): Payer: Self-pay | Admitting: *Deleted

## 2011-07-31 ENCOUNTER — Encounter (HOSPITAL_COMMUNITY): Payer: Self-pay | Admitting: *Deleted

## 2011-07-31 ENCOUNTER — Ambulatory Visit (INDEPENDENT_AMBULATORY_CARE_PROVIDER_SITE_OTHER): Payer: BC Managed Care – PPO | Admitting: Psychology

## 2011-07-31 ENCOUNTER — Encounter (HOSPITAL_COMMUNITY): Payer: Self-pay | Admitting: Psychology

## 2011-07-31 DIAGNOSIS — F331 Major depressive disorder, recurrent, moderate: Secondary | ICD-10-CM

## 2011-07-31 DIAGNOSIS — F411 Generalized anxiety disorder: Secondary | ICD-10-CM

## 2011-07-31 DIAGNOSIS — F4001 Agoraphobia with panic disorder: Secondary | ICD-10-CM

## 2011-07-31 NOTE — Progress Notes (Signed)
Patient:  Jesse Mayer   DOB: 14-Feb-1971  MR Number: [id number]  Location: [facility] [address] [place] [address] Dept: [phone]  Start: 9 AM End: 10 AM  Provider/Observer:     Hershal Coria PSYD  Chief Complaint:      Chief Complaint  Patient presents with  . Anxiety  . Depression  . Panic Attack    Reason For Service:    The patient was referred following an inpatient hospitalization. Maj. stressors include his work situation and his financial situations. The patient has a history of recurrent depression due to stress as well as underlying anxiety disorder with pronounced panic attacks in the past. The patient had some great difficulties in the past particularly after his divorce and moving to [place] and engaging in significant alcohol or drug abuse. He had significant panic attacks while under the influence of cocaine and LSD and subsequently moved back here after trying to stop these drug use. He continued to have depression as well as panic attacks and would also lead to paranoid types of ideation.   Interventions Strategy:  Cognitive/behavioral psychotherapeutic interventions  Participation Level:   Active  Participation Quality:  Appropriate      Behavioral Observation:  Well Groomed, Alert, and Appropriate.   Current Psychosocial Factors: The patient reports that he has been actively working on therapeutic interventions we have developed already and that he has been doing little bit better over the past 2 weeks. He reports that he has not had any panic attacks over the past few weeks. He reports he has been doing more activities with his family and is feeling much better about the situation.  Content of Session:   Reviewed current symptoms and continue to work on therapeutic interventions for issues of panic disorder, anxiety, and depression.  Current  Status:   The patient has shown some increased stability in his mood functioning, decreased levels of anxiety, and decreased frequency of panic attacks  Patient Progress:   Very good  Target Goals:   Target goals includes decrease in the frequency, duration, and intensity of panic attacks as well as reducing the overall symptoms of anxiety including level of worry and physiological distress.  Last Reviewed:   07/31/2011  Goals Addressed Today:    Today we worked specifically on issues related to his panic disorder working on decreasing the frequency, duration, and intensity of this condition.  Impression/Diagnosis:  This point, the patient does have a long history of psychiatric illness that included significant episodes of depression, significant substance abuse, and panic attacks. He is also at times with hallucinations with some of them being times when he was not abusing various substances.   Diagnosis:    Axis I:  1. Major depressive disorder, recurrent episode, moderate   2. 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 Generalized anxiety disorder         Axis II: No diagnosis

## 2011-08-02 ENCOUNTER — Encounter (HOSPITAL_COMMUNITY): Payer: Self-pay | Admitting: Emergency Medicine

## 2011-08-02 ENCOUNTER — Emergency Department (HOSPITAL_COMMUNITY)
Admission: EM | Admit: 2011-08-02 | Discharge: 2011-08-02 | Disposition: A | Discharge status: Home / Self Care | Payer: BC Managed Care – PPO | Attending: Emergency Medicine | Admitting: Emergency Medicine

## 2011-08-02 DIAGNOSIS — S335XXA Sprain of ligaments of lumbar spine, initial encounter: Secondary | ICD-10-CM | POA: Insufficient documentation

## 2011-08-02 DIAGNOSIS — S39012A Strain of muscle, fascia and tendon of lower back, initial encounter: Secondary | ICD-10-CM

## 2011-08-02 DIAGNOSIS — M545 Low back pain, unspecified: Secondary | ICD-10-CM | POA: Insufficient documentation

## 2011-08-02 DIAGNOSIS — F17200 Nicotine dependence, unspecified, uncomplicated: Secondary | ICD-10-CM | POA: Insufficient documentation

## 2011-08-02 DIAGNOSIS — X500XXA Overexertion from strenuous movement or load, initial encounter: Secondary | ICD-10-CM | POA: Insufficient documentation

## 2011-08-02 DIAGNOSIS — I10 Essential (primary) hypertension: Secondary | ICD-10-CM | POA: Insufficient documentation

## 2011-08-02 MED ORDER — OXYCODONE-ACETAMINOPHEN 5-325 MG PO TABS: 1.0000 | ORAL_TABLET | ORAL | Status: DC | PRN

## 2011-08-02 MED ORDER — PREDNISONE 20 MG PO TABS
80.0000 mg | ORAL_TABLET | Freq: Once | ORAL | Status: AC
  Administered 2011-08-02: 80 mg via ORAL
  Filled 2011-08-02: qty 4

## 2011-08-02 MED ORDER — PREDNISONE 10 MG PO TABS: ORAL_TABLET | ORAL | Status: DC

## 2011-08-02 MED ORDER — CYCLOBENZAPRINE HCL 5 MG PO TABS: 5.0000 mg | ORAL_TABLET | Freq: Three times a day (TID) | ORAL | Status: DC | PRN

## 2011-08-02 NOTE — ED Notes (Signed)
Patient reports moving heavy object and felt his back "pull". C/o lower back pain, denies bowel/bladder issues. Able to ambulate with steady gait.

## 2011-08-02 NOTE — ED Notes (Signed)
States he hurt back while moving furniture

## 2011-08-02 NOTE — ED Provider Notes (Signed)
History     CSN: [id number]  Arrival date & time 08/02/11  1702   First MD Initiated Contact with Patient 08/02/11 1710      Chief Complaint  Patient presents with  . Back Pain    (Consider location/radiation/quality/duration/timing/severity/associated sxs/prior treatment) HPI Comments: Jesse Mayer presents for evaluation of sudden onset low back pain after lifting a heavy piece of furniture about 45 minutes before arrival.  Pain is in his left lower back without radiation and is described as intense, constant muscle spasm.  He denies any weakness or numbness in his lower extremities and has had no bowel or bladder changes since the event.  He does have a history of prior episodes of similar back pain associated with lifting heavy objects.   The history is provided by the patient.    Past Medical History  Diagnosis Date  . Hypertension   . Anxiety   . Depression     Past Surgical History  Procedure Date  . Tonsillectomy     childhood  . Htn     No family history on file.  History  Substance Use Topics  . Smoking status: Current Everyday Smoker -- 1.0 packs/day for 20 years    Types: Cigarettes  . Smokeless tobacco: Not on file   Comment: would like info  . Alcohol Use: 0.6 oz/week    1 Cans of beer per week     only one beer in January 2013      Review of Systems  Constitutional: Negative for fever.  Respiratory: Negative for shortness of breath.   Cardiovascular: Negative for chest pain and leg swelling.  Gastrointestinal: Negative for abdominal pain, constipation and abdominal distention.  Genitourinary: Negative for dysuria, urgency, frequency, flank pain and difficulty urinating.  Musculoskeletal: Positive for back pain. Negative for joint swelling and gait problem.  Skin: Negative for rash.  Neurological: Negative for weakness and numbness.    Allergies  Review of patient's allergies indicates no known allergies.  Home Medications    Current Outpatient Rx  Name Route Sig Dispense Refill  . HYDROXYZINE PAMOATE 50 MG PO CAPS Oral Take 100 mg by mouth at bedtime.    [name] CITALOPRAM HYDROBROMIDE 40 MG PO TABS Oral Take 1 tablet (40 mg total) by mouth daily. For depression. 30 tablet 0  . HYDROCHLOROTHIAZIDE 12.5 MG PO CAPS Oral Take 1 capsule (12.5 mg total) by mouth daily. For blood pressure. 30 capsule 0  . LISINOPRIL 20 MG PO TABS Oral Take 1 tablet (20 mg total) by mouth 2 (two) times daily. For blood pressure 60 tablet 0  . RISPERIDONE 2 MG PO TABS Oral Take 1 tablet (2 mg total) by mouth at bedtime. For auditory hallucinations. 30 tablet 0    BP 114/61  Pulse 93  Temp(Src) 98.2 F (36.8 C) (Oral)  Resp 17  Ht 6\' 3"  (1.905 m)  Wt 350 lb (158.759 kg)  BMI 43.75 kg/m2  SpO2 96%  Physical Exam  Nursing note and vitals reviewed. Constitutional: He appears well-developed and well-nourished.  HENT:  Head: Normocephalic.  Eyes: Conjunctivae are normal.  Neck: Normal range of motion. Neck supple.  Cardiovascular: Normal rate and intact distal pulses.        Pedal pulses normal.  Pulmonary/Chest: Effort normal.  Abdominal: Soft. Bowel sounds are normal. He exhibits no distension and no mass.  Musculoskeletal: Normal range of motion. He exhibits no edema.       Lumbar back: He exhibits tenderness and spasm.  He exhibits no swelling and no edema.       Back:       Right paralumbar tenderness with palpable muscle spasm.  No midline tenderness.  Neurological: He is alert. He has normal strength. He displays no atrophy and no tremor. No sensory deficit. Gait normal.  Reflex Scores:      Patellar reflexes are 1+ on the right side and 1+ on the left side.      No strength deficit noted in hip and knee flexor and extensor muscle groups.  Ankle flexion and extension intact.  Skin: Skin is warm and dry.  Psychiatric: He has a normal mood and affect.    ED Course  Procedures (including critical care time)  Labs  Reviewed - No data to display No results found.   No diagnosis found.  Patient given first dose of prednisone 80 mg in the department.  MDM  Patient is driving home.  Prescriptions given for 6 day 60-10 mg prednisone taper, Flexeril and Percocet.  Encouraged ice to lower back for the next 24 hours, then switch to heat in 2 days.  Signs and symptoms of worsening injury discussed.  Patient will followup with his new PCP Dr. Jeanice Lim if not improved over the next week.        Burgess Amor, [place] 08/02/11 [phone]

## 2011-08-02 NOTE — Discharge Instructions (Signed)
Lumbosacral Strain Lumbosacral strain is one of the most common causes of back pain. There are many causes of back pain. Most are not serious conditions. CAUSES  Your backbone (spinal column) is made up of 24 main vertebral bodies, the sacrum, and the coccyx. These are held together by muscles and tough, fibrous tissue (ligaments). Nerve roots pass through the openings between the vertebrae. A sudden move or injury to the back may cause injury to, or pressure on, these nerves. This may result in localized back pain or pain movement (radiation) into the buttocks, down the leg, and into the foot. Sharp, shooting pain from the buttock down the back of the leg (sciatica) is frequently associated with a ruptured (herniated) disk. Pain may be caused by muscle spasm alone. Your caregiver can often find the cause of your pain by the details of your symptoms and an exam. In some cases, you may need tests (such as X-rays). Your caregiver will work with you to decide if any tests are needed based on your specific exam. HOME CARE INSTRUCTIONS   Avoid an underactive lifestyle. Active exercise, as directed by your caregiver, is your greatest weapon against back pain.   Avoid hard physical activities (tennis, racquetball, waterskiing) if you are not in proper physical condition for it. This may aggravate or create problems.   If you have a back problem, avoid sports requiring sudden body movements. Swimming and walking are generally safer activities.   Maintain good posture.   Avoid becoming overweight (obese).   Use bed rest for only the most extreme, sudden (acute) episode. Your caregiver will help you determine how much bed rest is necessary.   For acute conditions, you may put ice on the injured area.   Put ice in a plastic bag.   Place a towel between your skin and the bag.   Leave the ice on for 15 to 20 minutes at a time, every 2 hours, or as needed.   After you are improved and more active, it  may help to apply heat for 30 minutes before activities.  See your caregiver if you are having pain that lasts longer than expected. Your caregiver can advise appropriate exercises or therapy if needed. With conditioning, most back problems can be avoided. SEEK IMMEDIATE MEDICAL CARE IF:   You have numbness, tingling, weakness, or problems with the use of your arms or legs.   You experience severe back pain not relieved with medicines.   There is a change in bowel or bladder control.   You have increasing pain in any area of the body, including your belly (abdomen).   You notice shortness of breath, dizziness, or feel faint.   You feel sick to your stomach (nauseous), are throwing up (vomiting), or become sweaty.   You notice discoloration of your toes or legs, or your feet get very cold.   Your back pain is getting worse.   You have a fever.  MAKE SURE YOU:   Understand these instructions.   Will watch your condition.   Will get help right away if you are not doing well or get worse.  Document Released: 12/20/2004 Document Revised: 03/01/2011 Document Reviewed: 06/11/2008 [facility] Patient Information 2012 [place], [place].    Take your next dose of prednisone tomorrow evening.  Use the the other medicines as directed.  Do not drive within 4 hours of taking oxycodone as this will make you drowsy.  Avoid lifting,  Bending,  Twisting or any other activity that  worsens your pain over the next week.  Apply an  icepack  to your lower back for 10-15 minutes every 2 hours for the next day,  Then start using heat application for 20 minutes 4 times daily in 2 days.  You should get rechecked if your symptoms are not better over the next 5 days,  Or you develop increased pain,  Weakness in your leg(s) or loss of bladder or bowel function - these are symptoms of a worse injury.

## 2011-08-06 NOTE — ED Provider Notes (Signed)
Medical screening examination/treatment/procedure(s) were performed by non-physician practitioner and as supervising physician I was immediately available for consultation/collaboration.   Shelda Jakes, MD 08/06/11 [phone]

## 2011-08-09 ENCOUNTER — Encounter (HOSPITAL_COMMUNITY): Payer: Self-pay | Admitting: *Deleted

## 2011-08-09 ENCOUNTER — Ambulatory Visit (INDEPENDENT_AMBULATORY_CARE_PROVIDER_SITE_OTHER): Payer: BC Managed Care – PPO | Admitting: Psychiatry

## 2011-08-09 ENCOUNTER — Encounter (HOSPITAL_COMMUNITY): Payer: Self-pay | Admitting: Psychiatry

## 2011-08-09 DIAGNOSIS — F329 Major depressive disorder, single episode, unspecified: Secondary | ICD-10-CM

## 2011-08-09 MED ORDER — RISPERIDONE 2 MG PO TABS: 2.0000 mg | ORAL_TABLET | Freq: Every day | ORAL | Status: DC

## 2011-08-09 MED ORDER — HYDROXYZINE PAMOATE 100 MG PO CAPS: 100.0000 mg | ORAL_CAPSULE | Freq: Every day | ORAL | Status: DC

## 2011-08-09 MED ORDER — CITALOPRAM HYDROBROMIDE 40 MG PO TABS: 40.0000 mg | ORAL_TABLET | Freq: Every day | ORAL | Status: DC
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
## 2011-08-13 ENCOUNTER — Ambulatory Visit (INDEPENDENT_AMBULATORY_CARE_PROVIDER_SITE_OTHER): Payer: BC Managed Care – PPO | Admitting: Family Medicine

## 2011-08-13 ENCOUNTER — Encounter: Payer: Self-pay | Admitting: Family Medicine

## 2011-08-13 VITALS — BP 118/68 | HR 104 | Resp 18 | Ht 75.0 in | Wt 358.0 lb

## 2011-08-13 DIAGNOSIS — F333 Major depressive disorder, recurrent, severe with psychotic symptoms: Secondary | ICD-10-CM

## 2011-08-13 DIAGNOSIS — E669 Obesity, unspecified: Secondary | ICD-10-CM

## 2011-08-13 DIAGNOSIS — I10 Essential (primary) hypertension: Secondary | ICD-10-CM

## 2011-08-13 DIAGNOSIS — E785 Hyperlipidemia, unspecified: Secondary | ICD-10-CM

## 2011-08-13 DIAGNOSIS — Z720 Tobacco use: Secondary | ICD-10-CM

## 2011-08-13 DIAGNOSIS — F17200 Nicotine dependence, unspecified, uncomplicated: Secondary | ICD-10-CM

## 2011-08-13 MED ORDER — LISINOPRIL-HYDROCHLOROTHIAZIDE 20-25 MG PO TABS: 1.0000 | ORAL_TABLET | Freq: Every day | ORAL | Status: DC

## 2011-08-13 NOTE — Patient Instructions (Signed)
Start the lisinopril/HCTZ combination pill for your blood pressure Watch fried foods, fatty foods, increase water, fresh fruits/veggies  Watch your salt intake try not add any salt to foods  Continue other medications F/U 4 months for blood pressure

## 2011-08-14 ENCOUNTER — Encounter: Payer: Self-pay | Admitting: Family Medicine

## 2011-08-14 DIAGNOSIS — E785 Hyperlipidemia, unspecified: Secondary | ICD-10-CM | POA: Insufficient documentation

## 2011-08-14 DIAGNOSIS — I10 Essential (primary) hypertension: Secondary | ICD-10-CM | POA: Insufficient documentation

## 2011-08-14 DIAGNOSIS — Z720 Tobacco use: Secondary | ICD-10-CM | POA: Insufficient documentation

## 2011-08-14 NOTE — Assessment & Plan Note (Signed)
Discussed diet and increasing activity

## 2011-08-14 NOTE — Assessment & Plan Note (Signed)
LDL goal < 130, no meds needed

## 2011-08-14 NOTE — Assessment & Plan Note (Signed)
Followed by psychiatry, he feels he has a good handle on his mood disorders

## 2011-08-14 NOTE — Progress Notes (Signed)
  Subjective:    Patient ID: Jesse Mayer [facility], male    DOB: Oct 17, 1970, 41 y.o.   MRN: [id number]  HPI Pt here to establish care, no previous PCP , he was referred by [facility] for hypertension and lipid management.Started on HCTZ/lisinopril by inpatient psychiatry with elevated blood pressures. He has been doing well on medication.He is followed by psych for auditory hallucinations, depression and anxiety. He has been maintained on celexa, vistaril and risperdal and is doing well.He continues to work and has a supportive family. In the past he had problems with marijuana and cocaine abuse but has been clean for a few years now. History and meds reviewed Labs reviewed   Review of Systems  GEN- denies fatigue, fever, weight loss,weakness, recent illness HEENT- denies eye drainage, change in vision, nasal discharge, CVS- denies chest pain, palpitations RESP- denies SOB, cough, wheeze ABD- denies N/V, change in stools, abd pain GU- denies dysuria, hematuria, dribbling, incontinence MSK- denies joint pain, muscle aches, injury Neuro- denies headache, dizziness, syncope, seizure activity       Objective:   Physical Exam GEN- NAD, alert and oriented x3, obese HEENT- PERRL, EOMI, non injected sclera, pink conjunctiva, MMM, oropharynx clear Neck- Supple, no thryomegaly CVS- RRR, no murmur RESP-CTAB ABD-NABS,soft, NT,ND EXT- No edema Pulses- Radial, DP- 2+ Psych-normal affect and mood        Assessment & Plan:

## 2011-08-14 NOTE — Assessment & Plan Note (Signed)
Counseled on cessation 

## 2011-08-14 NOTE — Assessment & Plan Note (Signed)
Labs reviewed, will change medication to be put into combination pill for ease. HCTZ increased and ACEI decreased some

## 2011-08-15 ENCOUNTER — Telehealth: Payer: Self-pay | Admitting: Family Medicine

## 2011-08-15 NOTE — Telephone Encounter (Signed)
Note can be given, you will have to call and see where to fax this

## 2011-08-16 NOTE — Telephone Encounter (Signed)
Fax number is [phone]

## 2011-08-21 ENCOUNTER — Ambulatory Visit (INDEPENDENT_AMBULATORY_CARE_PROVIDER_SITE_OTHER): Payer: BC Managed Care – PPO | Admitting: Psychology

## 2011-08-21 ENCOUNTER — Encounter (HOSPITAL_COMMUNITY): Payer: Self-pay | Admitting: *Deleted

## 2011-08-21 DIAGNOSIS — F331 Major depressive disorder, recurrent, moderate: Secondary | ICD-10-CM

## 2011-08-21 DIAGNOSIS — F4001 Agoraphobia with panic disorder: Secondary | ICD-10-CM

## 2011-08-21 DIAGNOSIS — F411 Generalized anxiety disorder: Secondary | ICD-10-CM

## 2011-08-30 ENCOUNTER — Encounter (HOSPITAL_COMMUNITY): Payer: Self-pay | Admitting: Psychology

## 2011-08-30 NOTE — Progress Notes (Signed)
Patient:  Jesse Mayer   DOB: Apr 12, 1970  MR Number: [id number]  Location: BEHAVIORAL [facility] [address] [place] [address] Dept: [phone]  Start: 9 AM End: 10 AM  Provider/Observer:     Hershal Coria PSYD  Chief Complaint:      Chief Complaint  Patient presents with  . Stress  . Panic Attack  . Anxiety    Reason For Service:    The patient was referred following an inpatient hospitalization. Maj. stressors include his work situation and his financial situations. The patient has a history of recurrent depression due to stress as well as underlying anxiety disorder with pronounced panic attacks in the past. The patient had some great difficulties in the past particularly after his divorce and moving to [place] and engaging in significant alcohol or drug abuse. He had significant panic attacks while under the influence of cocaine and LSD and subsequently moved back here after trying to stop these drug use. He continued to have depression as well as panic attacks and would also lead to paranoid types of ideation.   Interventions Strategy:  Cognitive/behavioral psychotherapeutic interventions  Participation Level:   Active  Participation Quality:  Appropriate      Behavioral Observation:  Well Groomed, Alert, and Appropriate.   Current Psychosocial Factors: The patient reports that he is doing much better recently and that he has been actively engaging in various life functions more effectively..  Content of Session:   Reviewed current symptoms and continue to work on therapeutic interventions for issues of panic disorder, anxiety, and depression.  Current Status:   While the patient continues to struggle with various issues related panic disorder and anxiety/depression he is actively utilizing the coping skills and remaining highly motivated.  Patient Progress:   Very good  Target  Goals:   Target goals includes decrease in the frequency, duration, and intensity of panic attacks as well as reducing the overall symptoms of anxiety including level of worry and physiological distress.  Last Reviewed:   08/21/2011  Goals Addressed Today:    Today we worked specifically on issues related to his panic disorder working on decreasing the frequency, duration, and intensity of this condition.  Impression/Diagnosis:  This point, the patient does have a long history of psychiatric illness that included significant episodes of depression, significant substance abuse, and panic attacks. He is also at times with hallucinations with some of them being times when he was not abusing various substances.   Diagnosis:    Axis I:  1. Major depressive disorder, recurrent episode, moderate   2. 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 Generalized anxiety disorder         Axis II: No diagnosis

## 2011-09-06 ENCOUNTER — Ambulatory Visit (HOSPITAL_COMMUNITY): Payer: Self-pay | Admitting: Psychiatry

## 2011-09-10 ENCOUNTER — Ambulatory Visit (HOSPITAL_COMMUNITY): Payer: Self-pay | Admitting: Psychology

## 2011-10-04 ENCOUNTER — Encounter (HOSPITAL_COMMUNITY): Payer: Self-pay | Admitting: Psychiatry

## 2011-10-04 ENCOUNTER — Encounter (HOSPITAL_COMMUNITY): Payer: Self-pay | Admitting: *Deleted

## 2011-10-04 ENCOUNTER — Ambulatory Visit (INDEPENDENT_AMBULATORY_CARE_PROVIDER_SITE_OTHER): Payer: BC Managed Care – PPO | Admitting: Psychiatry

## 2011-10-04 VITALS — Wt 382.0 lb

## 2011-10-04 DIAGNOSIS — F323 Major depressive disorder, single episode, severe with psychotic features: Secondary | ICD-10-CM

## 2011-10-04 DIAGNOSIS — F329 Major depressive disorder, single episode, unspecified: Secondary | ICD-10-CM

## 2011-10-04 MED ORDER — CITALOPRAM HYDROBROMIDE 40 MG PO TABS: 40.0000 mg | ORAL_TABLET | Freq: Every day | ORAL | Status: DC

## 2011-10-04 MED ORDER — RISPERIDONE 2 MG PO TABS: 2.0000 mg | ORAL_TABLET | Freq: Every day | ORAL | Status: DC

## 2011-10-04 MED ORDER — HYDROXYZINE PAMOATE 100 MG PO CAPS: 100.0000 mg | ORAL_CAPSULE | Freq: Every day | ORAL | Status: DC
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
## 2011-10-11 ENCOUNTER — Encounter (HOSPITAL_COMMUNITY): Payer: Self-pay | Admitting: Psychology

## 2011-10-11 ENCOUNTER — Ambulatory Visit (INDEPENDENT_AMBULATORY_CARE_PROVIDER_SITE_OTHER): Payer: BC Managed Care – PPO | Admitting: Psychology

## 2011-10-11 DIAGNOSIS — F331 Major depressive disorder, recurrent, moderate: Secondary | ICD-10-CM

## 2011-10-11 DIAGNOSIS — F4001 Agoraphobia with panic disorder: Secondary | ICD-10-CM
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
## 2011-10-23 ENCOUNTER — Encounter (HOSPITAL_COMMUNITY): Payer: Self-pay | Admitting: Psychiatry

## 2011-10-23 ENCOUNTER — Ambulatory Visit (INDEPENDENT_AMBULATORY_CARE_PROVIDER_SITE_OTHER): Payer: BC Managed Care – PPO | Admitting: Psychiatry

## 2011-10-23 DIAGNOSIS — F323 Major depressive disorder, single episode, severe with psychotic features: Secondary | ICD-10-CM

## 2011-10-23 DIAGNOSIS — F329 Major depressive disorder, single episode, unspecified: Secondary | ICD-10-CM

## 2011-10-23 MED ORDER — RISPERIDONE 2 MG PO TABS: 2.0000 mg | ORAL_TABLET | Freq: Every day | ORAL | Status: DC

## 2011-10-23 MED ORDER — CITALOPRAM HYDROBROMIDE 40 MG PO TABS: 40.0000 mg | ORAL_TABLET | Freq: Every day | ORAL | Status: DC

## 2011-10-23 MED ORDER — HYDROXYZINE PAMOATE 100 MG PO CAPS: 100.0000 mg | ORAL_CAPSULE | Freq: Every day | ORAL | Status: DC
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
## 2011-10-25 ENCOUNTER — Ambulatory Visit (HOSPITAL_COMMUNITY): Payer: Self-pay | Admitting: Psychiatry

## 2011-11-01 ENCOUNTER — Telehealth (HOSPITAL_COMMUNITY): Payer: Self-pay | Admitting: *Deleted

## 2011-11-01 ENCOUNTER — Ambulatory Visit (HOSPITAL_COMMUNITY): Payer: Self-pay | Admitting: Psychiatry

## 2011-11-06 ENCOUNTER — Other Ambulatory Visit (HOSPITAL_COMMUNITY): Payer: Self-pay | Admitting: Psychiatry

## 2011-11-06 NOTE — Telephone Encounter (Signed)
Called returned.  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  He is taking multiple psychotropic medication that could cause sedation however patient denies any side effects.  He is not taking any control substance.  We will provide letter when he is coming for his appointment in September.  We will explain more in detail on his appointment.  Patient acknowledged and agreed.

## 2011-11-16 ENCOUNTER — Encounter (HOSPITAL_COMMUNITY): Payer: Self-pay | Admitting: *Deleted

## 2011-11-16 ENCOUNTER — Emergency Department (HOSPITAL_COMMUNITY)
Admission: EM | Admit: 2011-11-16 | Discharge: 2011-11-17 | Disposition: A | Discharge status: Home / Self Care | Payer: Self-pay | Attending: Emergency Medicine | Admitting: Emergency Medicine

## 2011-11-16 DIAGNOSIS — F3289 Other specified depressive episodes: Secondary | ICD-10-CM | POA: Insufficient documentation

## 2011-11-16 DIAGNOSIS — F17200 Nicotine dependence, unspecified, uncomplicated: Secondary | ICD-10-CM | POA: Insufficient documentation

## 2011-11-16 DIAGNOSIS — L0390 Cellulitis, unspecified: Secondary | ICD-10-CM

## 2011-11-16 DIAGNOSIS — I10 Essential (primary) hypertension: Secondary | ICD-10-CM | POA: Insufficient documentation

## 2011-11-16 DIAGNOSIS — L0231 Cutaneous abscess of buttock: Secondary | ICD-10-CM | POA: Insufficient documentation

## 2011-11-16 DIAGNOSIS — F329 Major depressive disorder, single episode, unspecified: Secondary | ICD-10-CM | POA: Insufficient documentation

## 2011-11-16 DIAGNOSIS — F411 Generalized anxiety disorder: Secondary | ICD-10-CM | POA: Insufficient documentation

## 2011-11-16 MED ORDER — VANCOMYCIN HCL IN DEXTROSE 1-5 GM/200ML-% IV SOLN
1000.0000 mg | Freq: Once | INTRAVENOUS | Status: AC
  Administered 2011-11-16: 1000 mg via INTRAVENOUS
  Filled 2011-11-16: qty 200

## 2011-11-16 MED ORDER — MORPHINE SULFATE 4 MG/ML IJ SOLN
4.0000 mg | Freq: Once | INTRAMUSCULAR | Status: AC
  Administered 2011-11-16: 4 mg via INTRAVENOUS

## 2011-11-16 MED ORDER — ONDANSETRON HCL 4 MG/2ML IJ SOLN
4.0000 mg | Freq: Once | INTRAMUSCULAR | Status: AC
  Administered 2011-11-16: 4 mg via INTRAVENOUS
  Filled 2011-11-16: qty 2

## 2011-11-16 MED ORDER — BUPIVACAINE HCL (PF) 0.25 % IJ SOLN
20.0000 mL | Freq: Once | INTRAMUSCULAR | Status: AC
  Administered 2011-11-16: 23:00:00

## 2011-11-16 MED ORDER — SODIUM CHLORIDE 0.9 % IV SOLN
Freq: Once | INTRAVENOUS | Status: AC
  Administered 2011-11-16: 23:00:00 via INTRAVENOUS

## 2011-11-16 MED ORDER — BUPIVACAINE HCL (PF) 0.25 % IJ SOLN
INTRAMUSCULAR | Status: AC
  Filled 2011-11-16: qty 30

## 2011-11-16 MED ORDER — MORPHINE SULFATE 4 MG/ML IJ SOLN
8.0000 mg | Freq: Once | INTRAMUSCULAR | Status: DC
  Filled 2011-11-16: qty 1

## 2011-11-16 NOTE — ED Provider Notes (Signed)
History     CSN: [id number]  Arrival date & time 11/16/11  2106   First MD Initiated Contact with Patient 11/16/11 2213      Chief Complaint  Patient presents with  . Abscess    (Consider location/radiation/quality/duration/timing/severity/associated sxs/prior treatment) Patient is a 41 y.o. male presenting with abscess. The history is provided by the patient.  Abscess  This is a new problem. The current episode started less than one week ago. The onset was gradual. The problem occurs continuously. The problem has been gradually worsening. Affected Location: left buttox. The problem is severe. The abscess is characterized by painfulness. Associated symptoms include decreased physical activity and a fever. Pertinent negatives include no cough. There were no sick contacts. He has received no recent medical care.    Past Medical History  Diagnosis Date  . Hypertension   . Anxiety   . Depression   . Substance abuse     in remission    Past Surgical History  Procedure Date  . Tonsillectomy     childhood  . Htn     Family History  Problem Relation Age of Onset  . Depression Maternal Grandfather     History  Substance Use Topics  . Smoking status: Current Everyday Smoker -- 1.0 packs/day for 20 years    Types: Cigarettes  . Smokeless tobacco: Not on file   Comment: would like info  . Alcohol Use: No     only one beer in January 2013      Review of Systems  Constitutional: Positive for fever. Negative for activity change.       All ROS Neg except as noted in HPI  HENT: Negative for nosebleeds and neck pain.   Eyes: Negative for photophobia and discharge.  Respiratory: Negative for cough, shortness of breath and wheezing.   Cardiovascular: Negative for chest pain and palpitations.  Gastrointestinal: Negative for abdominal pain and blood in stool.  Genitourinary: Negative for dysuria, frequency and hematuria.  Musculoskeletal: Negative for back pain and  arthralgias.  Skin: Negative.        abscess  Neurological: Negative for dizziness, seizures and speech difficulty.  Psychiatric/Behavioral: Negative for hallucinations and confusion. The patient is nervous/anxious.        Depression    Allergies  Review of patient's allergies indicates no known allergies.  Home Medications   Current Outpatient Rx  Name Route Sig Dispense Refill  . ACETAMINOPHEN 500 MG PO TABS Oral Take 1,000 mg by mouth once as needed. For pain    . CITALOPRAM HYDROBROMIDE 40 MG PO TABS Oral Take 1 tablet (40 mg total) by mouth daily. For depression. 30 tablet 1  . HYDROXYZINE PAMOATE 100 MG PO CAPS Oral Take 1 capsule (100 mg total) by mouth at bedtime. 30 capsule 1  . LISINOPRIL-HYDROCHLOROTHIAZIDE 20-25 MG PO TABS Oral Take 1 tablet by mouth daily. 90 tablet 1  . RISPERIDONE 2 MG PO TABS Oral Take 1 tablet (2 mg total) by mouth at bedtime. For auditory hallucinations. 30 tablet 1    BP 126/68  Pulse 132  Temp 100.3 F (37.9 C) (Oral)  Resp 20  Ht 6\' 4"  (1.93 m)  Wt 380 lb (172.367 kg)  BMI 46.26 kg/m2  SpO2 96%  Physical Exam  Nursing note and vitals reviewed. Constitutional: He is oriented to person, place, and time. He appears well-developed and well-nourished.  Non-toxic appearance.  HENT:  Head: Normocephalic.  Right Ear: Tympanic membrane and external ear normal.  Left  Ear: Tympanic membrane and external ear normal.  Eyes: EOM and lids are normal. Pupils are equal, round, and reactive to light.  Neck: Normal range of motion. Neck supple. Carotid bruit is not present.  Cardiovascular: Regular rhythm, normal heart sounds, intact distal pulses and normal pulses.  Tachycardia present.   Pulmonary/Chest: Breath sounds normal. No respiratory distress.  Abdominal: Soft. Bowel sounds are normal. There is no tenderness. There is no guarding.  Genitourinary:       Moderate sized abscess of the left buttox. Painful to palpation. Warm to touch. Anus not  involved.  Musculoskeletal: Normal range of motion.  Lymphadenopathy:       Head (right side): No submandibular adenopathy present.       Head (left side): No submandibular adenopathy present.    He has no cervical adenopathy.  Neurological: He is alert and oriented to person, place, and time. He has normal strength. No cranial nerve deficit or sensory deficit.  Skin: Skin is warm and dry.  Psychiatric: He has a normal mood and affect. His speech is normal.    ED Course  Procedures : I and D of ABSCESS LEFT BUTTOX -  the patient was identified by arm band. Permission for procedure was given by the patient. Procedural time out was taken before incision and drainage of abscess of the left buttocks. The left upper buttocks was painted with Betadine. It was infiltrated with 2% plain lidocaine. The area was then cleansed with chlorhexidine. Incision and drainage was carried out with a #11 blade scalpel. Moderate amount of foul-smelling pus like material was removed. A culture was obtained and sent to the lab. The abscess area was irrigated with normal saline. The abscess was then packed with one half inch iodoform gauze. Sterile dressing was applied by me. The patient tolerated the procedure without problem or complication.   Labs Reviewed  CULTURE, ROUTINE-ABSCESS   No results found.   No diagnosis found.    MDM  I have reviewed nursing notes, vital signs, and all appropriate lab and imaging results for this patient.  Patient has a history of recurrent abscess areas of the buttocks. He been advised that he should have surgical intervention but has not had that done as of yet. The patient most recently has had fever or chills aching. And particularly pain of the left buttocks the abscess area. Incision and drainage was carried out. The patient is advised to have the packing removed on August 26. Prescription for Bactrim DS, Keflex 500 mg, and Percocet 5 mg #15 tablets given to the patient.  Patient advised to return sooner if any changes, problems, or concerns.      Kathie Dike, [place] 11/17/11 [phone]

## 2011-11-16 NOTE — ED Notes (Signed)
States he has an abscess on his left buttock, states it has been there for about a month or so, worse just recently.

## 2011-11-16 NOTE — ED Notes (Addendum)
Abscess to buttocks, fever, nausea.feels achy.  Took tylenol pta,Temp was 102 pta.

## 2011-11-17 MED ORDER — CEPHALEXIN 500 MG PO CAPS: 500.0000 mg | ORAL_CAPSULE | Freq: Four times a day (QID) | ORAL | Status: AC

## 2011-11-17 MED ORDER — SULFAMETHOXAZOLE-TRIMETHOPRIM 800-160 MG PO TABS: 1.0000 | ORAL_TABLET | Freq: Two times a day (BID) | ORAL | Status: AC

## 2011-11-17 MED ORDER — OXYCODONE-ACETAMINOPHEN 5-325 MG PO TABS: 1.0000 | ORAL_TABLET | Freq: Four times a day (QID) | ORAL | Status: AC | PRN

## 2011-11-17 NOTE — ED Notes (Signed)
Vancomycin infusion complete with no complications obvious.  No complaints of pain.  Wound re taped.

## 2011-11-18 NOTE — ED Provider Notes (Signed)
Medical screening examination/treatment/procedure(s) were performed by non-physician practitioner and as supervising physician I was immediately available for consultation/collaboration.   Shelda Jakes, MD 11/18/11 [phone]

## 2011-11-19 ENCOUNTER — Emergency Department (HOSPITAL_COMMUNITY)
Admission: EM | Admit: 2011-11-19 | Discharge: 2011-11-19 | Disposition: A | Discharge status: Home / Self Care | Payer: Self-pay | Attending: Emergency Medicine | Admitting: Emergency Medicine

## 2011-11-19 ENCOUNTER — Encounter (HOSPITAL_COMMUNITY): Payer: Self-pay | Admitting: *Deleted

## 2011-11-19 DIAGNOSIS — F411 Generalized anxiety disorder: Secondary | ICD-10-CM | POA: Insufficient documentation

## 2011-11-19 DIAGNOSIS — F3289 Other specified depressive episodes: Secondary | ICD-10-CM | POA: Insufficient documentation

## 2011-11-19 DIAGNOSIS — Z5189 Encounter for other specified aftercare: Secondary | ICD-10-CM

## 2011-11-19 DIAGNOSIS — I10 Essential (primary) hypertension: Secondary | ICD-10-CM | POA: Insufficient documentation

## 2011-11-19 DIAGNOSIS — F329 Major depressive disorder, single episode, unspecified: Secondary | ICD-10-CM | POA: Insufficient documentation

## 2011-11-19 DIAGNOSIS — F17200 Nicotine dependence, unspecified, uncomplicated: Secondary | ICD-10-CM | POA: Insufficient documentation

## 2011-11-19 DIAGNOSIS — L0231 Cutaneous abscess of buttock: Secondary | ICD-10-CM | POA: Insufficient documentation

## 2011-11-19 DIAGNOSIS — L03317 Cellulitis of buttock: Secondary | ICD-10-CM | POA: Insufficient documentation

## 2011-11-19 DIAGNOSIS — Z4800 Encounter for change or removal of nonsurgical wound dressing: Secondary | ICD-10-CM | POA: Insufficient documentation

## 2011-11-19 NOTE — ED Notes (Signed)
Upon RN assessment EDPA at bedside. Site assessed. Some packing removed. Site re-dressed. Pt tolerated well.

## 2011-11-19 NOTE — ED Provider Notes (Signed)
History     CSN: [id number]  Arrival date & time 11/19/11  4098   First MD Initiated Contact with Patient 11/19/11 [phone]      Chief Complaint  Patient presents with  . Wound Check    (Consider location/radiation/quality/duration/timing/severity/associated sxs/prior treatment) HPI Comments: Pt told to return today for re-check.  Patient is a 41 y.o. male presenting with wound check. The history is provided by the patient. No language interpreter was used.  Wound Check  He was treated in the ED 2 to 3 days ago. Previous treatment in the ED includes I&D of abscess. Treatments since wound repair include oral antibiotics. His temperature was unmeasured prior to arrival. There has been bloody discharge from the wound.    Past Medical History  Diagnosis Date  . Hypertension   . Anxiety   . Depression   . Substance abuse     in remission    Past Surgical History  Procedure Date  . Tonsillectomy     childhood  . Htn     Family History  Problem Relation Age of Onset  . Depression Maternal Grandfather     History  Substance Use Topics  . Smoking status: Current Everyday Smoker -- 1.0 packs/day for 20 years    Types: Cigarettes  . Smokeless tobacco: Not on file   Comment: would like info  . Alcohol Use: No     only one beer in January 2013      Review of Systems  Constitutional: Negative for fever.  Skin: Positive for wound.  All other systems reviewed and are negative.    Allergies  Review of patient's allergies indicates no known allergies.  Home Medications   Current Outpatient Rx  Name Route Sig Dispense Refill  . ACETAMINOPHEN 500 MG PO TABS Oral Take 1,000 mg by mouth once as needed. For pain    . CEPHALEXIN 500 MG PO CAPS Oral Take 1 capsule (500 mg total) by mouth 4 (four) times daily. 20 capsule 0  . CITALOPRAM HYDROBROMIDE 40 MG PO TABS Oral Take 1 tablet (40 mg total) by mouth daily. For depression. 30 tablet 1  . HYDROXYZINE PAMOATE 100 MG PO  CAPS Oral Take 1 capsule (100 mg total) by mouth at bedtime. 30 capsule 1  . LISINOPRIL-HYDROCHLOROTHIAZIDE 20-25 MG PO TABS Oral Take 1 tablet by mouth daily. 90 tablet 1  . OXYCODONE-ACETAMINOPHEN 5-325 MG PO TABS Oral Take 1 tablet by mouth every 6 (six) hours as needed for pain. 15 tablet 0  . RISPERIDONE 2 MG PO TABS Oral Take 1 tablet (2 mg total) by mouth at bedtime. For auditory hallucinations. 30 tablet 1  . SULFAMETHOXAZOLE-TRIMETHOPRIM 800-160 MG PO TABS Oral Take 1 tablet by mouth every 12 (twelve) hours. 14 tablet 0    BP 125/87  Pulse 80  Temp 98.5 F (36.9 C) (Oral)  Resp 18  Ht 6\' 3"  (1.905 m)  Wt 380 lb (172.367 kg)  BMI 47.50 kg/m2  SpO2 100%  Physical Exam  Nursing note and vitals reviewed. Constitutional: He is oriented to person, place, and time. He appears well-developed and well-nourished.  HENT:  Head: Normocephalic and atraumatic.  Eyes: EOM are normal.  Neck: Normal range of motion.  Cardiovascular: Normal rate, regular rhythm, normal heart sounds and intact distal pulses.   Pulmonary/Chest: Effort normal and breath sounds normal. No respiratory distress.  Abdominal: Soft. He exhibits no distension. There is no tenderness.  Musculoskeletal: Normal range of motion.  Legs:      Blood and drainage evident on dressing materials.  ~ 6 inches of packing removed.  Pt told to remove ~ 3 inches of packing daily until gone.  Neurological: He is alert and oriented to person, place, and time.  Skin: Skin is warm and dry.  Psychiatric: He has a normal mood and affect. Judgment normal.    ED Course  Procedures (including critical care time)  Labs Reviewed - No data to display No results found.   1. Wound check, abscess       MDM  Continue abx.  Warm compresses Remove ~ 3 inches of packing QD Return prn         Evalina Field, [place] 11/19/11 [phone]

## 2011-11-19 NOTE — ED Provider Notes (Signed)
Medical screening examination/treatment/procedure(s) were performed by non-physician practitioner and as supervising physician I was immediately available for consultation/collaboration. Devoria Albe, MD, FACEP   Ward Givens, MD 11/19/11 [phone]

## 2011-11-19 NOTE — ED Notes (Signed)
Pt had abscess on lt buttock drained and packed here on Friday, here for wound check and repacking

## 2011-11-19 NOTE — Discharge Instructions (Signed)
Heat Therapy Your caregiver advises heat therapy for your condition. Heat applications help reduce pain and muscle spasm around injuries or areas of inflammation. They also increase blood flow to the area which can speed healing. Moist heat is commonly used to help heal skin infections. Heat treatments should be used for about 30-40 minutes every 2-4 hours. Shorter treatments should be used if there is discomfort. Different forms of heat therapy are:  Warm water - Use a basin or tub filled with heated water; change it often to keep the water hot. The water temperature should not be uncomfortable to the skin.   Hot packs - Use several bath towels soaked in hot water and lightly wrung out. These should be changed every 5-10 minutes. You can buy commercially-available packs that provide more sustained heat. Hot water bottles are not recommended because they give only a small amount of heat.   Electric heating pads - These may be used for dry heat only. Do not use wet material around a regular heating pad because of the risk of electrical shock. Do not leave heating pads on for long periods as they can burn the skin or cause permanent discoloration. Do not lie on top of a heating pad because, again, this can cause a burn.   Heat lamps - Use an infrared light. Keep the bulb 15-25 inches from the skin. Watch for signs of excessive heat (blotchy areas will appear).  Be cautious with heat therapy to avoid burning the skin. You should not use heat therapy without careful medical supervision if you have: circulation problems, numbness or unusual swelling in the area to be treated. Document Released: 03/12/2005 Document Revised: 03/01/2011 Document Reviewed: 09/07/2006 [facility] Patient Information 2012 [place], [place].   Continue taking the antibiotic until gone. Apply warm compresses several times daily.  Return as needed.

## 2011-11-20 LAB — CULTURE, ROUTINE-ABSCESS

## 2011-11-21 NOTE — ED Notes (Signed)
Chart sent to EDP office for review °

## 2011-12-17 ENCOUNTER — Ambulatory Visit: Payer: Self-pay | Admitting: Family Medicine

## 2011-12-20 ENCOUNTER — Ambulatory Visit (HOSPITAL_COMMUNITY): Payer: Self-pay | Admitting: Psychiatry

## 2012-01-25 ENCOUNTER — Other Ambulatory Visit: Payer: Self-pay

## 2012-01-25 ENCOUNTER — Telehealth: Payer: Self-pay | Admitting: Family Medicine

## 2012-01-25 ENCOUNTER — Encounter (HOSPITAL_COMMUNITY): Payer: Self-pay | Admitting: Psychiatry

## 2012-01-25 ENCOUNTER — Ambulatory Visit (INDEPENDENT_AMBULATORY_CARE_PROVIDER_SITE_OTHER): Payer: 59 | Admitting: Psychiatry

## 2012-01-25 VITALS — Ht 72.25 in | Wt 393.4 lb

## 2012-01-25 DIAGNOSIS — Z720 Tobacco use: Secondary | ICD-10-CM

## 2012-01-25 DIAGNOSIS — F323 Major depressive disorder, single episode, severe with psychotic features: Secondary | ICD-10-CM

## 2012-01-25 DIAGNOSIS — E669 Obesity, unspecified: Secondary | ICD-10-CM

## 2012-01-25 DIAGNOSIS — F333 Major depressive disorder, recurrent, severe with psychotic symptoms: Secondary | ICD-10-CM

## 2012-01-25 DIAGNOSIS — F329 Major depressive disorder, single episode, unspecified: Secondary | ICD-10-CM

## 2012-01-25 DIAGNOSIS — F411 Generalized anxiety disorder: Secondary | ICD-10-CM

## 2012-01-25 MED ORDER — CITALOPRAM HYDROBROMIDE 40 MG PO TABS: 40.0000 mg | ORAL_TABLET | Freq: Every day | ORAL | Status: DC

## 2012-01-25 MED ORDER — HYDROXYZINE PAMOATE 100 MG PO CAPS: 100.0000 mg | ORAL_CAPSULE | Freq: Every day | ORAL | Status: DC

## 2012-01-25 MED ORDER — LISINOPRIL-HYDROCHLOROTHIAZIDE 20-25 MG PO TABS: 1.0000 | ORAL_TABLET | Freq: Every day | ORAL | Status: DC

## 2012-01-25 MED ORDER — NICOTINE 7 MG/24HR TD PT24: 1.0000 | MEDICATED_PATCH | TRANSDERMAL | Status: DC

## 2012-01-25 MED ORDER — NICOTINE 14 MG/24HR TD PT24: 1.0000 | MEDICATED_PATCH | TRANSDERMAL | Status: DC

## 2012-01-25 MED ORDER — RISPERIDONE 2 MG PO TABS: 2.0000 mg | ORAL_TABLET | Freq: Every day | ORAL | Status: DC

## 2012-01-25 NOTE — Telephone Encounter (Signed)
Med refilled.
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
## 2012-01-25 NOTE — Patient Instructions (Signed)
For smoking cessation, should continue 14 mg daily for 3-4 weeks, then 7 mg daily for 2-4 final weeks.  Call the Smoking Cessation hotline at  (573) 405-7780  for support.  REMEMBER these cost the about the same as a pack of cigarettes a day, but when you stop, you have $5 a day more to spend on yourself and your health.

## 2012-02-11 ENCOUNTER — Ambulatory Visit (INDEPENDENT_AMBULATORY_CARE_PROVIDER_SITE_OTHER): Payer: 59 | Admitting: Psychology

## 2012-02-11 DIAGNOSIS — F331 Major depressive disorder, recurrent, moderate: Secondary | ICD-10-CM

## 2012-02-11 DIAGNOSIS — F4001 Agoraphobia with panic disorder: Secondary | ICD-10-CM

## 2012-02-15 ENCOUNTER — Encounter (HOSPITAL_COMMUNITY): Payer: Self-pay | Admitting: Psychology

## 2012-02-15 NOTE — Progress Notes (Signed)
Patient:  Jesse Mayer   DOB: 1970-11-12  MR Number: [id number]  Location: [facility] [address] [place] [address] Dept: [phone]  Start: 9 AM End: 10 AM  Provider/Observer:     Hershal Coria PSYD  Chief Complaint:      Chief Complaint  Patient presents with  . Panic Attack  . Depression    Reason For Service:    The patient was referred following an inpatient hospitalization. Maj. stressors include his work situation and his financial situations. The patient has a history of recurrent depression due to stress as well as underlying anxiety disorder with pronounced panic attacks in the past. The patient had some great difficulties in the past particularly after his divorce and moving to [place] and engaging in significant alcohol or drug abuse. He had significant panic attacks while under the influence of cocaine and LSD and subsequently moved back here after trying to stop these drug use. He continued to have depression as well as panic attacks and would also lead to paranoid types of ideation.   Interventions Strategy:  Cognitive/behavioral psychotherapeutic interventions  Participation Level:   Active  Participation Quality:  Appropriate      Behavioral Observation:  Well Groomed, Alert, and Appropriate.   Current Psychosocial Factors: The patient comes in today and reports that he has been able to find a job and follow through on the therapeutic interventions we address. He reports that he has not been back since July 2 20 lack of insurance between jobs. The patient reports that he is working in [place] now and actually is working a job that his pain more than when he was at. The patient reports that he actively limited some of the strategies we've develop to get this job and reports that he is quite pleased with his progress.   Content of Session:   Reviewed current  symptoms and continue to work on therapeutic interventions for issues of panic disorder, anxiety, and depression.  Current Status:   While the patient continues to struggle with various issues related panic disorder and anxiety/depression he is actively utilizing the coping skills and remaining highly motivated.  Patient Progress:   Very good  Target Goals:   Target goals includes decrease in the frequency, duration, and intensity of panic attacks as well as reducing the overall symptoms of anxiety including level of worry and physiological distress.  Last Reviewed:   02/12/2012  Goals Addressed Today:    Today we worked specifically on issues related to his panic disorder working on decreasing the frequency, duration, and intensity of this condition.  Impression/Diagnosis:  This point, the patient does have a long history of psychiatric illness that included significant episodes of depression, significant substance abuse, and panic attacks. He is also at times with hallucinations with some of them being times when he was not abusing various substances.   Diagnosis:    Axis I:  1. Major depressive disorder, recurrent episode, moderate   2. 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

## 2012-04-11 ENCOUNTER — Emergency Department (HOSPITAL_COMMUNITY): Payer: Managed Care, Other (non HMO)

## 2012-04-11 ENCOUNTER — Encounter (HOSPITAL_COMMUNITY): Payer: Self-pay | Admitting: *Deleted

## 2012-04-11 ENCOUNTER — Emergency Department (HOSPITAL_COMMUNITY)
Admission: EM | Admit: 2012-04-11 | Discharge: 2012-04-11 | Disposition: A | Discharge status: Home / Self Care | Payer: Managed Care, Other (non HMO) | Attending: Emergency Medicine | Admitting: Emergency Medicine

## 2012-04-11 DIAGNOSIS — F1911 Other psychoactive substance abuse, in remission: Secondary | ICD-10-CM | POA: Insufficient documentation

## 2012-04-11 DIAGNOSIS — I10 Essential (primary) hypertension: Secondary | ICD-10-CM | POA: Insufficient documentation

## 2012-04-11 DIAGNOSIS — M79673 Pain in unspecified foot: Secondary | ICD-10-CM

## 2012-04-11 DIAGNOSIS — Z8659 Personal history of other mental and behavioral disorders: Secondary | ICD-10-CM | POA: Insufficient documentation

## 2012-04-11 DIAGNOSIS — M7660 Achilles tendinitis, unspecified leg: Secondary | ICD-10-CM | POA: Insufficient documentation

## 2012-04-11 DIAGNOSIS — F17200 Nicotine dependence, unspecified, uncomplicated: Secondary | ICD-10-CM | POA: Insufficient documentation

## 2012-04-11 DIAGNOSIS — M25579 Pain in unspecified ankle and joints of unspecified foot: Secondary | ICD-10-CM | POA: Insufficient documentation

## 2012-04-11 DIAGNOSIS — Z79899 Other long term (current) drug therapy: Secondary | ICD-10-CM | POA: Insufficient documentation

## 2012-04-11 DIAGNOSIS — F329 Major depressive disorder, single episode, unspecified: Secondary | ICD-10-CM | POA: Insufficient documentation

## 2012-04-11 DIAGNOSIS — F3289 Other specified depressive episodes: Secondary | ICD-10-CM | POA: Insufficient documentation

## 2012-04-11 MED ORDER — MELOXICAM 7.5 MG PO TABS: ORAL_TABLET | ORAL | Status: DC

## 2012-04-11 MED ORDER — HYDROCODONE-ACETAMINOPHEN 7.5-325 MG PO TABS: 1.0000 | ORAL_TABLET | ORAL | Status: AC | PRN

## 2012-04-11 NOTE — ED Provider Notes (Signed)
History     CSN: [id number]  Arrival date & time 04/11/12  1712   First MD Initiated Contact with Patient 04/11/12 1743      Chief Complaint  Patient presents with  . Ankle Pain    (Consider location/radiation/quality/duration/timing/severity/associated sxs/prior treatment) HPI Comments: Pt reports he has a New job. Standing 8 to 10 hrs per day, Monday - Friday. Hx of broken bone during child hood, but no problem since childhood years. No hx of procedures on the foot. He also has pain of the back of the ankle with movement of the foot.  Patient is a 42 y.o. male presenting with ankle pain. The history is provided by the patient.  Ankle Pain  The incident occurred more than 1 week ago. There was no injury mechanism. The pain is present in the left ankle. The quality of the pain is described as aching. The pain is moderate. Associated symptoms include inability to bear weight. Pertinent negatives include no numbness, no loss of motion, no loss of sensation and no tingling. He reports no foreign bodies present. The symptoms are aggravated by bearing weight. He has tried acetaminophen for the symptoms. The treatment provided no relief.    Past Medical History  Diagnosis Date  . Hypertension   . Anxiety   . Depression   . Substance abuse     in remission    Past Surgical History  Procedure Date  . Tonsillectomy     childhood  . Htn     Family History  Problem Relation Age of Onset  . Depression Maternal Grandfather   . OCD Mother   . Alcohol abuse Maternal Uncle   . Drug abuse Maternal Uncle     History  Substance Use Topics  . Smoking status: Current Every Day Smoker -- 1.0 packs/day for 20 years    Types: Cigarettes  . Smokeless tobacco: Not on file     Comment: would like info  . Alcohol Use: No     Comment: only one beer in January 2013      Review of Systems  Constitutional: Negative for activity change.       All ROS Neg except as noted in HPI  HENT:  Negative for nosebleeds and neck pain.   Eyes: Negative for photophobia and discharge.  Respiratory: Negative for cough, shortness of breath and wheezing.   Cardiovascular: Negative for chest pain and palpitations.  Gastrointestinal: Negative for abdominal pain and blood in stool.  Genitourinary: Negative for dysuria, frequency and hematuria.  Musculoskeletal: Positive for arthralgias and gait problem. Negative for back pain.  Skin: Negative.   Neurological: Negative for dizziness, tingling, seizures, speech difficulty and numbness.  Psychiatric/Behavioral: Negative for hallucinations and confusion.    Allergies  Review of patient's allergies indicates no known allergies.  Home Medications   Current Outpatient Rx  Name  Route  Sig  Dispense  Refill  . ACETAMINOPHEN 500 MG PO TABS   Oral   Take 1,000 mg by mouth once as needed. For pain         . CITALOPRAM HYDROBROMIDE 40 MG PO TABS   Oral   Take 1 tablet (40 mg total) by mouth daily. For depression.   90 tablet   1   . HYDROXYZINE PAMOATE 100 MG PO CAPS   Oral   Take 1 capsule (100 mg total) by mouth at bedtime.   90 capsule   1   . LISINOPRIL-HYDROCHLOROTHIAZIDE 20-25 MG PO TABS   Oral  Take 1 tablet by mouth daily.   90 tablet   1   . NICOTINE 14 MG/24HR TD PT24   Transdermal   Place 1 patch onto the skin daily.   28 patch   0   . NICOTINE 7 MG/24HR TD PT24   Transdermal   Place 1 patch onto the skin daily.   28 patch   0   . RISPERIDONE 2 MG PO TABS   Oral   Take 1 tablet (2 mg total) by mouth at bedtime. For auditory hallucinations.   90 tablet   1     BP 132/95  Pulse 106  Temp 98.2 F (36.8 C) (Oral)  Resp 20  Ht 6\' 4"  (1.93 m)  Wt 390 lb (176.903 kg)  BMI 47.47 kg/m2  SpO2 98%  Physical Exam  Nursing note and vitals reviewed. Constitutional: He is oriented to person, place, and time. He appears well-developed and well-nourished.  Non-toxic appearance.  HENT:  Head: Normocephalic.    Right Ear: Tympanic membrane and external ear normal.  Left Ear: Tympanic membrane and external ear normal.  Eyes: EOM and lids are normal. Pupils are equal, round, and reactive to light.  Neck: Normal range of motion. Neck supple. Carotid bruit is not present.  Cardiovascular: Normal rate, regular rhythm, normal heart sounds, intact distal pulses and normal pulses.   Pulmonary/Chest: Breath sounds normal. No respiratory distress.  Abdominal: Soft. Bowel sounds are normal. There is no tenderness. There is no guarding.  Musculoskeletal: Normal range of motion.       There is pain to palpation of the dorsum of the left foot. No hot areas appreciated. There is pain with flexion and extension of the Achilles tendon. There is no deformity of the Achilles. There is full range of motion of the toes. The capillary refill is less than 3 seconds. The dorsalis pedis and posterior tibial pulses are 2+. There is no plantar puncture area appreciated.  Lymphadenopathy:       Head (right side): No submandibular adenopathy present.       Head (left side): No submandibular adenopathy present.    He has no cervical adenopathy.  Neurological: He is alert and oriented to person, place, and time. He has normal strength. No cranial nerve deficit or sensory deficit.  Skin: Skin is warm and dry.  Psychiatric: He has a normal mood and affect. His speech is normal.    ED Course  Procedures (including critical care time)  Labs Reviewed - No data to display No results found.   No diagnosis found.    MDM  I have reviewed nursing notes, vital signs, and all appropriate lab and imaging results for this patient. X-ray of the left ankle reveals deformity of the metatarsal head seems to be remote. X-ray of the left foot show the ankle mortise to be well maintained there is no fracture appreciated there are remote posterior medical changes involving the medial malleolus. Patient has a pannus (deformity.  The  treatment at this time will consist of an ankle stirrup splint and crutches, Mobic 7.5 mg daily and Norco 7.5 mg every 4 hours as needed for pain. The patient is referred to podiatry for additional evaluation and management.       Kathie Dike, [place] 04/11/12 1956

## 2012-04-11 NOTE — ED Notes (Signed)
Pain lt ankle for 2 mos. No injury known

## 2012-04-11 NOTE — ED Notes (Signed)
Pt c/o left ankle pain x3 months. Pt states the pain began after starting his new job which requires him to be on his feet 10 hours/day. Pt states that day after working he is unable to put any weight on his ankle due to pain.

## 2012-04-12 NOTE — ED Provider Notes (Signed)
Medical screening examination/treatment/procedure(s) were performed by non-physician practitioner and as supervising physician I was immediately available for consultation/collaboration.   Pamila Mendibles M Teddy Pena, DO 04/12/12 0045 

## 2012-04-28 ENCOUNTER — Ambulatory Visit (HOSPITAL_COMMUNITY): Payer: Self-pay | Admitting: Psychiatry

## 2012-05-06 ENCOUNTER — Ambulatory Visit (INDEPENDENT_AMBULATORY_CARE_PROVIDER_SITE_OTHER): Payer: 59 | Admitting: Psychiatry

## 2012-05-06 ENCOUNTER — Encounter (HOSPITAL_COMMUNITY): Payer: Self-pay | Admitting: Psychiatry

## 2012-05-06 VITALS — Wt >= 6400 oz

## 2012-05-06 DIAGNOSIS — F411 Generalized anxiety disorder: Secondary | ICD-10-CM

## 2012-05-06 DIAGNOSIS — R69 Illness, unspecified: Secondary | ICD-10-CM | POA: Insufficient documentation

## 2012-05-06 DIAGNOSIS — Z720 Tobacco use: Secondary | ICD-10-CM

## 2012-05-06 DIAGNOSIS — E669 Obesity, unspecified: Secondary | ICD-10-CM

## 2012-05-06 DIAGNOSIS — F17200 Nicotine dependence, unspecified, uncomplicated: Secondary | ICD-10-CM

## 2012-05-06 DIAGNOSIS — F329 Major depressive disorder, single episode, unspecified: Secondary | ICD-10-CM

## 2012-05-06 DIAGNOSIS — F333 Major depressive disorder, recurrent, severe with psychotic symptoms: Secondary | ICD-10-CM

## 2012-05-06 MED ORDER — HYDROXYZINE PAMOATE 100 MG PO CAPS: 100.0000 mg | ORAL_CAPSULE | Freq: Every day | ORAL | Status: DC

## 2012-05-06 MED ORDER — RISPERIDONE 2 MG PO TABS: 2.0000 mg | ORAL_TABLET | Freq: Every day | ORAL | Status: DC
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
## 2012-05-06 NOTE — Patient Instructions (Signed)
CUT BACK/CUT OUT on sugar and carbohydrates, that means very limited fruits and starchy vegetables and very limited grains, breads  The goal is low GLYCEMIC INDEX.  CUT OUT all wheat, rye, or barley for the GLUTEN in them.  HIGH fat and LOW carbohydrate diet is the KEY.  Eat avocados, eggs, lean meat like grass fed beef and chicken  Nuts and seeds would be good foods as well.   Stevia is an excellent sweetener.  Safe for the brain.   Almond butter is awesome.  Check out all this on the Internet.  Dr Cleotis Nipper is the guy to look up on line to find out about dietary suggestions.  Call if problems or concerns.

## 2012-07-04 ENCOUNTER — Ambulatory Visit (HOSPITAL_COMMUNITY): Payer: Self-pay | Admitting: Psychiatry

## 2012-07-11 ENCOUNTER — Ambulatory Visit (HOSPITAL_COMMUNITY): Payer: Self-pay | Admitting: Psychiatry

## 2012-09-25 ENCOUNTER — Other Ambulatory Visit (HOSPITAL_COMMUNITY): Payer: Self-pay | Admitting: Podiatry

## 2012-09-25 DIAGNOSIS — M1990 Unspecified osteoarthritis, unspecified site: Secondary | ICD-10-CM

## 2012-10-03 ENCOUNTER — Ambulatory Visit (HOSPITAL_COMMUNITY): Payer: Managed Care, Other (non HMO)

## 2012-11-11 ENCOUNTER — Encounter: Payer: Self-pay | Admitting: Family Medicine

## 2012-11-11 ENCOUNTER — Ambulatory Visit (INDEPENDENT_AMBULATORY_CARE_PROVIDER_SITE_OTHER): Payer: Managed Care, Other (non HMO) | Admitting: Family Medicine

## 2012-11-11 ENCOUNTER — Other Ambulatory Visit: Payer: Self-pay | Admitting: Family Medicine

## 2012-11-11 VITALS — BP 140/96 | HR 96 | Temp 99.2°F | Ht 73.5 in | Wt >= 6400 oz

## 2012-11-11 DIAGNOSIS — K625 Hemorrhage of anus and rectum: Secondary | ICD-10-CM

## 2012-11-11 DIAGNOSIS — K529 Noninfective gastroenteritis and colitis, unspecified: Secondary | ICD-10-CM

## 2012-11-11 DIAGNOSIS — I10 Essential (primary) hypertension: Secondary | ICD-10-CM

## 2012-11-11 DIAGNOSIS — E785 Hyperlipidemia, unspecified: Secondary | ICD-10-CM

## 2012-11-11 DIAGNOSIS — Z0000 Encounter for general adult medical examination without abnormal findings: Secondary | ICD-10-CM

## 2012-11-11 DIAGNOSIS — R197 Diarrhea, unspecified: Secondary | ICD-10-CM

## 2012-11-11 DIAGNOSIS — G4730 Sleep apnea, unspecified: Secondary | ICD-10-CM

## 2012-11-11 DIAGNOSIS — L0233 Carbuncle of buttock: Secondary | ICD-10-CM

## 2012-11-11 DIAGNOSIS — Z23 Encounter for immunization: Secondary | ICD-10-CM

## 2012-11-11 DIAGNOSIS — Z125 Encounter for screening for malignant neoplasm of prostate: Secondary | ICD-10-CM

## 2012-11-11 DIAGNOSIS — E669 Obesity, unspecified: Secondary | ICD-10-CM

## 2012-11-11 DIAGNOSIS — L0232 Furuncle of buttock: Secondary | ICD-10-CM

## 2012-11-11 MED ORDER — SULFAMETHOXAZOLE-TMP DS 800-160 MG PO TABS: 1.0000 | ORAL_TABLET | Freq: Two times a day (BID) | ORAL | Status: DC

## 2012-11-11 MED ORDER — LISINOPRIL 20 MG PO TABS: 20.0000 mg | ORAL_TABLET | Freq: Every day | ORAL | Status: DC

## 2012-11-11 NOTE — Assessment & Plan Note (Signed)
Check FLP 

## 2012-11-11 NOTE — Progress Notes (Signed)
  Subjective:    Patient ID: Jesse Mayer, male    DOB: 12/30/1970, 42 y.o.   MRN: [id number]  HPI  Recurrent boil on buttcoks, had I and D in ER a few months ago  Review of Systems     Objective:   Physical Exam  Right buttucks boil noted, mild fluctance, no erythema, NT      Assessment & Plan:

## 2012-11-11 NOTE — Assessment & Plan Note (Signed)
Bactrim

## 2012-11-11 NOTE — Assessment & Plan Note (Signed)
Very poor diet and empty calories, high sugar and fat intake See instructions Refer to nutrition

## 2012-11-11 NOTE — Addendum Note (Signed)
Addended by: Milinda Antis F on: 11/11/2012 10:33 PM   Modules accepted: Orders

## 2012-11-11 NOTE — Assessment & Plan Note (Signed)
Exam concerning with BRBPR Differentials include colitis, chrons , UC CT abd pelvis Refer to GI

## 2012-11-11 NOTE — Assessment & Plan Note (Signed)
restat lisinopril Unable to tolerate HCTZ due to urinary frequency and job

## 2012-11-11 NOTE — Assessment & Plan Note (Addendum)
Fasting labs  TDAP Discussed importance of follow-up on regular basis Discussed PSA testing, pros and cons, pt elected to proceed

## 2012-11-11 NOTE — Assessment & Plan Note (Signed)
Refer to pulmonary

## 2012-11-11 NOTE — Patient Instructions (Signed)
For blood pressure, start 20mg  daily for pressure  For sleep apnea- referral to lung doctor for sleep study  FOr blood in stool- referral to GI -  CT scan of abdomen  We will call with lab results Referral to nutritionist  [employer] APP Decrease 2 lemonades, Decrease Milk 2 cups of milk The rest is water Eat Breakfast every morning - Yogurt, protein, bar, cereal  Eat a fruit or vegetable with each meal  F/U 6 weeks

## 2012-11-11 NOTE — Assessment & Plan Note (Signed)
GI referral ?hemorroids vs colitis  CT scan

## 2012-11-11 NOTE — Progress Notes (Signed)
  Subjective:    Patient ID: Jesse Mayer, male    DOB: 17-Dec-1970, 42 y.o.   MRN: [id number]  HPI Patient here for complete physical exam he's been lost to followup he was last seen for his establishing visit in may of 2013. He has multiple concerns today. He has gained 50 pounds since last year. 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 He is concerned about diabetes mellitus as he is often fatigued and feels sweaty during the day. He also has not been eating very well typically drinks 4-32 ounces of lemonade in 2-32 ounce glasses of milk a day he often eats hot dogs during lunch and eat over portions at dinnertime along with junk food in the evening. His wife is concerned about his snoring seems that he stops breathing in the middle of the night. He also notes that he always has diarrheal stools for the past few years. He very rarely has formed stool. He denies any abdominal pain or straining but notes that he has blood many times when he wipes after a bowel movement. Note he was also being treated depression with psychosis however he stopped taking his medications around March of this year because he thought it could be contributing to his weight gain. He states his mood has been fine and he has been sleeping fairly well with the exception of the snoring above    Review of Systems  GEN- + fatigue, fever, weight loss,weakness, recent illness HEENT- denies eye drainage, change in vision, nasal discharge, CVS- denies chest pain, palpitations RESP- denies SOB, cough, wheeze ABD- denies N/V, +change in stools, abd pain GU- denies dysuria, hematuria, dribbling, incontinence MSK- denies joint pain, muscle aches, injury Neuro- denies headache, dizziness, syncope, seizure activity      Objective:   Physical Exam GEN- NAD, alert and oriented x3, obese HEENT- PERRL, EOMI, non  injected sclera, pink conjunctiva, MMM, oropharynx clear Neck- Supple,  CVS- RRR, no murmur RESP-CTAB ABD-NABS,soft,NT,ND Rectum- normal external apperance, normal tone, BRBPR, prostate smooth, no apparent enlargement, no stool in vault EXT- No edema Pulses- Radial, DP- 2+        Assessment & Plan:

## 2012-11-12 ENCOUNTER — Telehealth (HOSPITAL_COMMUNITY): Payer: Self-pay | Admitting: Dietician

## 2012-11-12 LAB — COMPREHENSIVE METABOLIC PANEL
ALT: 27 U/L (ref 0–53)
CO2: 27 mEq/L (ref 19–32)
Creat: 1.04 mg/dL (ref 0.50–1.35)
Total Bilirubin: 0.7 mg/dL (ref 0.3–1.2)

## 2012-11-12 LAB — LIPID PANEL
HDL: 38 mg/dL — ABNORMAL LOW (ref >39)
LDL Cholesterol: 148 mg/dL — ABNORMAL HIGH (ref 0–99)
Total CHOL/HDL Ratio: 5.8 Ratio
Triglycerides: 166 mg/dL — ABNORMAL HIGH (ref <150)
VLDL: 33 mg/dL (ref 0–40)

## 2012-11-12 LAB — CBC WITH DIFFERENTIAL/PLATELET
Basophils Absolute: 0 10*3/uL (ref 0.0–0.1)
Eosinophils Relative: 2 % (ref 0–5)
HCT: 47.4 % (ref 39.0–52.0)
Hemoglobin: 16.1 g/dL (ref 13.0–17.0)
Lymphocytes Relative: 41 % (ref 12–46)
Lymphs Abs: 3.5 10*3/uL (ref 0.7–4.0)
MCV: 80.3 fL (ref 78.0–100.0)
Monocytes Absolute: 0.6 10*3/uL (ref 0.1–1.0)
Monocytes Relative: 7 % (ref 3–12)
RDW: 15.7 % — ABNORMAL HIGH (ref 11.5–15.5)
WBC: 8.5 10*3/uL (ref 4.0–10.5)

## 2012-11-12 NOTE — Telephone Encounter (Signed)
Received referral via fax from [facility] for dx: obesity.  

## 2012-11-12 NOTE — Telephone Encounter (Signed)
Received call from pt at 1227. He reports that he is interested in making an appointment, but needs to look at his schedule. He reports he will call this RD back.

## 2012-11-12 NOTE — Telephone Encounter (Signed)
Called and left message on voicemail at 1220.

## 2012-11-13 MED ORDER — SIMVASTATIN 10 MG PO TABS: 10.0000 mg | ORAL_TABLET | Freq: Every day | ORAL | Status: DC

## 2012-11-13 NOTE — Addendum Note (Signed)
Addended by: Milinda Antis F on: 11/13/2012 04:13 PM   Modules accepted: Orders

## 2012-11-14 ENCOUNTER — Telehealth: Payer: Self-pay | Admitting: Family Medicine

## 2012-11-14 NOTE — Telephone Encounter (Signed)
Message copied by Donne Anon on Fri Nov 14, 2012  9:40 AM ------      Message from: Milinda Antis F      Created: Thu Nov 13, 2012  4:12 PM       Please give lab results: Kidney, liver function normal, no anemia seen, cholesterol is too high. Avoid fried foods, fatty foods, eat low cholesterol foods- fresh fruits and veggies, like we discussed      Start cholesterol pill at bedtime zocor 10mg             Please Add A1C - elevated blood sugar             ------

## 2012-11-14 NOTE — Telephone Encounter (Signed)
Pt aware of lab results.  New medication has been ordered and 6 week f/u appt made.

## 2012-11-17 ENCOUNTER — Other Ambulatory Visit: Payer: Self-pay

## 2012-11-17 NOTE — Telephone Encounter (Signed)
No further response from pt received. Sent letter to pt home via [place] [employer] in attempt to contact pt to schedule appointment.

## 2012-11-19 NOTE — Telephone Encounter (Signed)
Received call from pt at 1028. Appointment scheduled for 11/27/12 at 1500.

## 2012-11-21 ENCOUNTER — Ambulatory Visit
Admission: RE | Admit: 2012-11-21 | Discharge: 2012-11-21 | Disposition: A | Discharge status: Home / Self Care | Payer: Managed Care, Other (non HMO) | Source: Ambulatory Visit | Attending: Family Medicine | Admitting: Family Medicine

## 2012-11-21 DIAGNOSIS — K625 Hemorrhage of anus and rectum: Secondary | ICD-10-CM

## 2012-11-21 DIAGNOSIS — K529 Noninfective gastroenteritis and colitis, unspecified: Secondary | ICD-10-CM

## 2012-11-21 MED ORDER — IOHEXOL 300 MG/ML  SOLN
125.0000 mL | Freq: Once | INTRAMUSCULAR | Status: AC | PRN
  Administered 2012-11-21: 125 mL via INTRAVENOUS

## 2012-11-26 ENCOUNTER — Telehealth (HOSPITAL_COMMUNITY): Payer: Self-pay | Admitting: Dietician

## 2012-11-26 NOTE — Telephone Encounter (Signed)
Received voicemail left at [phone] inquiring about status of referral. Called back at 0922. Informed that pt appointment is tomorrow, 11/27/12 at 1500.

## 2012-11-27 ENCOUNTER — Encounter (HOSPITAL_COMMUNITY): Payer: Self-pay | Admitting: Dietician

## 2012-11-27 ENCOUNTER — Encounter (INDEPENDENT_AMBULATORY_CARE_PROVIDER_SITE_OTHER): Payer: Self-pay | Admitting: *Deleted

## 2012-11-27 NOTE — Progress Notes (Signed)
Outpatient Initial Nutrition Assessment  Date:11/27/2012   Appt Start Time: 1440  Referring Physician: Dr. Jeanice Lim Reason for Visit: obesity, HTN, Hgb A1c: 5.9  Nutrition Assessment:  Height: 6' 1.5" (186.7 cm)   Weight: 400 lb (181.439 kg) (self reported; office scale does not accomodate wt above 350)   IBW: 187# %IBW:  214% UBW: 260# %UBW: 154% There is no weight on file to calculate BMI.  Goal Weight: 360# (10% loss of normal body weight) Weight hx: Pt reports weight has fluctuated since age 42. He usually would gain and lose 70# every year. UBW: between 260-310#. He has gained 150# over the past 3 years.   Estimated nutritional needs:  Kcals/ day: 2700-2800 Protein (grams)/day: 145-181 Fluid (L)/ day: 2.7-2.8  PMH:  Past Medical History  Diagnosis Date  . Hypertension   . Anxiety   . Depression   . Substance abuse     in remission    Medications:  Current Outpatient Rx  Name  Route  Sig  Dispense  Refill  . HYDROcodone-acetaminophen (NORCO/VICODIN) 5-325 MG per tablet   Oral   Take 1 tablet by mouth every 6 (six) hours as needed for pain.         [name] lisinopril (PRINIVIL,ZESTRIL) 20 MG tablet   Oral   Take 1 tablet (20 mg total) by mouth daily.   30 tablet   3   . simvastatin (ZOCOR) 10 MG tablet   Oral   Take 1 tablet (10 mg total) by mouth at bedtime.   30 tablet   3   . sulfamethoxazole-trimethoprim (BACTRIM DS) 800-160 MG per tablet   Oral   Take 1 tablet by mouth 2 (two) times daily.   14 tablet   0     Labs: CMP     Component Value Date/Time   NA 141 11/11/2012 1632   K 5.1 11/11/2012 1632   CL 103 11/11/2012 1632   CO2 27 11/11/2012 1632   GLUCOSE 79 11/11/2012 1632   BUN 15 11/11/2012 1632   CREATININE 1.04 11/11/2012 1632   CREATININE 0.92 06/30/2011 0710   CALCIUM 10.2 11/11/2012 1632   PROT 8.0 11/11/2012 1632   ALBUMIN 4.7 11/11/2012 1632   AST 22 11/11/2012 1632   ALT 27 11/11/2012 1632   ALKPHOS 52 11/11/2012 1632   BILITOT 0.7 11/11/2012 1632    GFRNONAA >90 06/30/2011 0710   GFRAA >90 06/30/2011 0710    Lipid Panel     Component Value Date/Time   CHOL 219* 11/11/2012 1632   TRIG 166* 11/11/2012 1632   HDL 38* 11/11/2012 1632   CHOLHDL 5.8 11/11/2012 1632   VLDL 33 11/11/2012 1632   LDLCALC 148* 11/11/2012 1632     Lab Results  Component Value Date   HGBA1C 5.9* 11/11/2012   HGBA1C 5.6 07/02/2011   Lab Results  Component Value Date   LDLCALC 148* 11/11/2012   CREATININE 1.04 11/11/2012     Lifestyle/ social habits: Jesse Mayer is a very pleasant gentleman who resides in [place], [place] with his wife and two daughters, ages 9 and 76. 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 Work schedule is typically 7-4 Monday through Friday. He reports a high stress level due to his weight and risk factors that are associated with obesity. He is frustrated that he is unable to lose weight like he normally does. Another stressor is his foot injury, which limits his physical activity. He has not participated in regular activities since his  early 20's, when he played baseball and football in college. He reports he was able to keep his weight off until about 3 years ago, since he had an active job at [employer]. However, he now holds a sedentary jobs and does no physical activity outside of what his job requires and what his foot injury limits him to do.    Nutrition hx/habits: Jesse Mayer reports minimal changes in his diet recently, with the exception of choosing healthier snack items like fruit and nutrigrain bars instead of junk foods. He typically skips breakfast and lunch and drinks 4-5 32 oz glasses on pink lemonade daily (which is approximately 2200 calories in total). He will eat dinner at a fast food restaurant 4-5 times during the week, due to time constraints from his and his wife's schedule and his daughters' after school activities. He also reports drinking 3 20-24 oz glasses of 2% milk daily in the evening. He often  snacks in the middle of the night.  He has used MyFitnessPal on and off to track calories, but reports not doing this lately due to stress. He reports that he can eat as much as 6000 calories in a day. He has been trying to make better food choices and watching his portions, but he expresses frustration that this normally backfires for him. For example, he tells this RD if he eats a small hamburger at a fast food restaurants, he will often go home and eat more food.    Diet recall: Between 5:30 AM-4 PM: 4-5 32 ox glasses of pink lemonade; 6:00: [employer] bowl with chicken tenders, mashed potatoes, corn, green beans, and cheese, beverage; Snack: 3 20-24 oz glasses of milk and nutrtigrain bar.   Nutrition Diagnosis: Involuntary weight gain r/t excessive energy intake, sedentary lifestyle AEB   Nutrition Intervention: Nutrition rx: 2200-2500 kcal NAS, no sugar added diet; 3 meals per day; limit snacks; low calorie beverages most often; physical activity as tolerated  Education/Counseling Provided: Educated pt on principles of weight management. Discussed principles of energy expenditure and how changes in diet and physical activity affect weight status. Reviewed labs and discussed how poor lifestyle factors and results put pt at higher risk for developing co-morbidities, such as diabetes and heart disease. Discussed nutritional content of commonly eaten foods and suggested healthier alternatives. Educated pt on plate method and a general, healthful diet that includes low fat dairy, lean meats, whole fruits and vegetables, and whole grains most often. Discussed importance of a healthy diet along with regular physical activity (at least 30 minutes 5 times per week) to achieve weight loss goals. Discussed difeerent exercise options; stressed importance of dietary modifications, especially due to decreased tolerance for exercise. Encouraged slow, moderate weight loss (0.5-2# weight loss per week) and adopting healthy  lifestyle changes vs. obtaining a certain body type or weight. Reviewed benefits of MyFitnessPal and encouraged using a food diary to better track caloric intake. Used TeachBack to assess understanding.   Understanding, Motivation, Ability to Follow Recommendations:  Monitoring and Evaluation: Goals: 1) 0.5-2# weight loss per week; 2) Physical activity as tolerated; 3) Hgb A1c < 5.9; 4) Keep food diary  Recommendations: 1) For weight loss: 2200-2500 kcals daily; 2) Break physical activity into smaller, more frequent sessions and work toward goals; 3) Focus on exercises you can do that requires less stress on feet; 4) Substitute water or low calorie beverages for pink lemonade most often; 5) Input data and MyFitnessPal throughout the day  F/U: 4-6 weeks. Scheduled  for 01/08/13 at 1500.  Special Ranes A. Mayford Knife, RD, LDN 11/27/2012  Appt EndTime: 4098

## 2012-12-18 ENCOUNTER — Encounter (INDEPENDENT_AMBULATORY_CARE_PROVIDER_SITE_OTHER): Payer: Self-pay | Admitting: Internal Medicine

## 2012-12-18 ENCOUNTER — Telehealth (INDEPENDENT_AMBULATORY_CARE_PROVIDER_SITE_OTHER): Payer: Self-pay | Admitting: *Deleted

## 2012-12-18 ENCOUNTER — Other Ambulatory Visit (INDEPENDENT_AMBULATORY_CARE_PROVIDER_SITE_OTHER): Payer: Self-pay | Admitting: *Deleted

## 2012-12-18 ENCOUNTER — Ambulatory Visit (INDEPENDENT_AMBULATORY_CARE_PROVIDER_SITE_OTHER): Payer: Managed Care, Other (non HMO) | Admitting: Internal Medicine

## 2012-12-18 VITALS — BP 126/78 | HR 84 | Temp 99.0°F | Ht 75.0 in | Wt 395.7 lb

## 2012-12-18 DIAGNOSIS — Z1211 Encounter for screening for malignant neoplasm of colon: Secondary | ICD-10-CM

## 2012-12-18 DIAGNOSIS — K625 Hemorrhage of anus and rectum: Secondary | ICD-10-CM

## 2012-12-18 NOTE — Patient Instructions (Addendum)
Colonoscopy with Dr. Rehman. The risks and benefits such as perforation, bleeding, and infection were reviewed with the patient and is agreeable. 

## 2012-12-18 NOTE — Progress Notes (Signed)
Subjective:     Patient ID: Jesse Mayer, male   DOB: [birth year], 42 y.o.   MRN: [id number]  HPI Kweli is a 42 yr old black male referred to our office by Dr. Jeanice Lim for bright red rectal bleeding. He has had rectal bleeding off and on. Sees about once a month. He has had rectal bleeding for years. He see the blood in the toilet tissue. He denies being constipated with the rectal bleeding. No pain associated with the rectal bleeding. His stools are brown in color. Usually has 1-2 stools a day. No change in his stools.  Saw Dr. Jeanice Lim last month and rectal exam was positive for blood.  He is not taking any NSAID's. Appetite is good. No weight loss. No abdominal pain No melena. Hx of colon cancer in a grandmother age mid-38's.   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  Review of Systems see hpi Current Outpatient Prescriptions  Medication Sig Dispense Refill  . HYDROcodone-acetaminophen (NORCO/VICODIN) 5-325 MG per tablet Take 1 tablet by mouth every 6 (six) hours as needed for pain.      [name] lisinopril (PRINIVIL,ZESTRIL) 20 MG tablet Take 1 tablet (20 mg total) by mouth daily.  30 tablet  3  . simvastatin (ZOCOR) 10 MG tablet Take 1 tablet (10 mg total) by mouth at bedtime.  30 tablet  3   No current facility-administered medications for this visit.   Past Medical History  Diagnosis Date  . Hypertension   . Anxiety   . Depression   . Substance abuse     in remission   No Known Allergies Past Surgical History  Procedure Laterality Date  . Tonsillectomy      childhood  . Htn          Objective:   Physical Exam Filed Vitals:   12/18/12 1538  BP: 126/78  Pulse: 84  Temp: 99 F (37.2 C)  Height: 6\' 3"  (1.905 m)  Weight: 395 lb 11.2 oz (179.488 kg)   Alert and oriented. Skin warm and dry. Oral mucosa is moist.   . Sclera anicteric, conjunctivae is pink. Thyroid not enlarged. No cervical lymphadenopathy. Lungs  clear. Heart regular rate and rhythm.  Abdomen is soft. Bowel sounds are positive. No hepatomegaly. No abdominal masses felt. No tenderness.  No edema to lower extremities. Stool brown and guaiac negative.    Assessment:    Rectal bleeding. Probably hemorrhoidal in nature. However, family hx of colon cancer in a grandmother. Colonic neoplasm, polyp, ulcer, AVM needs to  Be ruled out.    Plan:     Colonoscopy with Dr. Karilyn Cota. The risks and benefits such as perforation, bleeding, and infection were reviewed with the patient and is agreeable.

## 2012-12-18 NOTE — Telephone Encounter (Signed)
Patient needs movi prep 

## 2012-12-19 MED ORDER — PEG-KCL-NACL-NASULF-NA ASC-C 100 G PO SOLR: 1.0000 | Freq: Once | ORAL | Status: DC

## 2012-12-23 ENCOUNTER — Other Ambulatory Visit (HOSPITAL_COMMUNITY): Payer: Self-pay

## 2012-12-23 DIAGNOSIS — G4730 Sleep apnea, unspecified: Secondary | ICD-10-CM

## 2012-12-26 ENCOUNTER — Ambulatory Visit (INDEPENDENT_AMBULATORY_CARE_PROVIDER_SITE_OTHER): Payer: Managed Care, Other (non HMO) | Admitting: Family Medicine

## 2012-12-26 ENCOUNTER — Encounter: Payer: Self-pay | Admitting: Family Medicine

## 2012-12-26 VITALS — BP 110/70 | HR 88 | Temp 98.8°F | Resp 18 | Wt 395.0 lb

## 2012-12-26 DIAGNOSIS — L0232 Furuncle of buttock: Secondary | ICD-10-CM

## 2012-12-26 DIAGNOSIS — I10 Essential (primary) hypertension: Secondary | ICD-10-CM

## 2012-12-26 DIAGNOSIS — E669 Obesity, unspecified: Secondary | ICD-10-CM

## 2012-12-26 DIAGNOSIS — E785 Hyperlipidemia, unspecified: Secondary | ICD-10-CM

## 2012-12-26 DIAGNOSIS — L0233 Carbuncle of buttock: Secondary | ICD-10-CM

## 2012-12-26 NOTE — Patient Instructions (Signed)
General surgery referral for the boil Blood pressure looks good  F/u with specialist F/U 4 months

## 2012-12-28 NOTE — Assessment & Plan Note (Signed)
Discussed dietary changes again, given a 7 day handout with examples of meals and ways to substitute between 1800-2000 calories He also uses my fitness pal APP

## 2012-12-28 NOTE — Assessment & Plan Note (Signed)
Continue zocor 10mg , FLP check next visit with LFT

## 2012-12-28 NOTE — Assessment & Plan Note (Signed)
BP stable on lisinopril

## 2012-12-28 NOTE — Progress Notes (Signed)
  Subjective:    Patient ID: Jesse Mayer, male    DOB: 09-11-1970, 42 y.o.   MRN: [id number]  HPI  Pt here for intermin f/u visit on multiple problems HTN- tolerating lisinopril without any difficulties Obesity- met with nutritionist, did not feel he gained anything from the visit, has lost 5lbs cutting back his milk and sugary beverages during the day. 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 OSA- has sleep study set up next week/ Rectal bleeding- has colonoscopy next week Boil- bactrim helped some continues to have drainage on and off, he also notes he has a small hole lower down where pus oozes as well  Review of Systems  GEN- denies fatigue, fever, weight loss,weakness, recent illness HEENT- denies eye drainage, change in vision, nasal discharge, CVS- denies chest pain, palpitations RESP- denies SOB, cough, wheeze MSK- + joint pain, muscle aches, injury Neuro- denies headache, dizziness, syncope, seizure activity      Objective:   Physical Exam  GEN- NAD, alert and oriented x3, obese  CVS- RRR, no murmur RESP-CTAB Skin- right buttocks boil noted inner cleft, no fluctuance but indurated, NT, lower in cleft tiny hole noted, no drainage EXT- No edema Pulses- Radial 2+      Assessment & Plan:

## 2012-12-28 NOTE — Assessment & Plan Note (Signed)
?   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

## 2012-12-29 ENCOUNTER — Encounter (HOSPITAL_COMMUNITY): Payer: Self-pay | Admitting: Pharmacy Technician

## 2013-01-01 ENCOUNTER — Ambulatory Visit (HOSPITAL_COMMUNITY)
Admission: RE | Admit: 2013-01-01 | Discharge: 2013-01-01 | Disposition: A | Discharge status: Home / Self Care | Payer: Managed Care, Other (non HMO) | Source: Ambulatory Visit | Attending: Internal Medicine | Admitting: Internal Medicine

## 2013-01-01 ENCOUNTER — Encounter (HOSPITAL_COMMUNITY): Payer: Self-pay | Admitting: *Deleted

## 2013-01-01 ENCOUNTER — Encounter (HOSPITAL_COMMUNITY)
Admission: RE | Disposition: A | Discharge status: Home / Self Care | Payer: Self-pay | Source: Ambulatory Visit | Attending: Internal Medicine

## 2013-01-01 DIAGNOSIS — D128 Benign neoplasm of rectum: Secondary | ICD-10-CM

## 2013-01-01 DIAGNOSIS — K921 Melena: Secondary | ICD-10-CM | POA: Insufficient documentation

## 2013-01-01 DIAGNOSIS — K648 Other hemorrhoids: Secondary | ICD-10-CM | POA: Insufficient documentation

## 2013-01-01 DIAGNOSIS — I10 Essential (primary) hypertension: Secondary | ICD-10-CM | POA: Insufficient documentation

## 2013-01-01 DIAGNOSIS — K644 Residual hemorrhoidal skin tags: Secondary | ICD-10-CM

## 2013-01-01 DIAGNOSIS — Z800 Family history of malignant neoplasm of digestive organs: Secondary | ICD-10-CM | POA: Insufficient documentation

## 2013-01-01 DIAGNOSIS — D129 Benign neoplasm of anus and anal canal: Secondary | ICD-10-CM

## 2013-01-01 DIAGNOSIS — K625 Hemorrhage of anus and rectum: Secondary | ICD-10-CM

## 2013-01-01 HISTORY — PX: COLONOSCOPY: SHX5424

## 2013-01-01 SURGERY — COLONOSCOPY
Anesthesia: Moderate Sedation

## 2013-01-01 MED ORDER — STERILE WATER FOR IRRIGATION IR SOLN
Status: DC | PRN
  Administered 2013-01-01: 09:00:00

## 2013-01-01 MED ORDER — MEPERIDINE HCL 50 MG/ML IJ SOLN
INTRAMUSCULAR | Status: DC | PRN
  Administered 2013-01-01 (×2): 25 mg via INTRAVENOUS

## 2013-01-01 MED ORDER — SODIUM CHLORIDE 0.9 % IV SOLN
INTRAVENOUS | Status: DC
  Administered 2013-01-01: 08:00:00 via INTRAVENOUS

## 2013-01-01 MED ORDER — MIDAZOLAM HCL 5 MG/5ML IJ SOLN
INTRAMUSCULAR | Status: DC | PRN
  Administered 2013-01-01: 2 mg via INTRAVENOUS
  Administered 2013-01-01: 3 mg via INTRAVENOUS
  Administered 2013-01-01: 2 mg via INTRAVENOUS

## 2013-01-01 MED ORDER — MIDAZOLAM HCL 5 MG/5ML IJ SOLN
INTRAMUSCULAR | Status: AC
  Filled 2013-01-01: qty 10

## 2013-01-01 MED ORDER — MEPERIDINE HCL 50 MG/ML IJ SOLN
INTRAMUSCULAR | Status: AC
  Filled 2013-01-01: qty 1

## 2013-01-01 NOTE — H&P (Signed)
Jesse Mayer is an 42 y.o. male.   Chief Complaint: [name] here for colonoscopy. HPI: Patient is 42 year old male who presents with intermittent hematochezia for at least one year duration. His stools are generally soft. He denies constipation. Denies abdominal pain. CBC on 11/11/2012 was normal with H&H of 16.1 and 47.4. Family history is positive for colon carcinoma and paternal grandmother and she was in her late 24s.  Past Medical History  Diagnosis Date  . Hypertension   . Anxiety   . Depression   . Substance abuse     in remission    Past Surgical History  Procedure Laterality Date  . Tonsillectomy      childhood  . Htn      Family History  Problem Relation Age of Onset  . Depression Maternal Grandfather   . Dementia Maternal Grandfather     and MGGM, his mother  . OCD Mother   . Alcohol abuse Maternal Uncle   . Drug abuse Maternal Uncle   . ADD / ADHD Neg Hx   . Anxiety disorder Neg Hx   . Bipolar disorder Neg Hx   . Paranoid behavior Neg Hx   . Schizophrenia Neg Hx   . Seizures Neg Hx   . Sexual abuse Neg Hx   . Physical abuse Neg Hx   . Cancer Paternal Grandmother     colon cancer  . Colon cancer Paternal Grandmother    Social History:  reports that he has been smoking Cigarettes.  He has a 20 pack-year smoking history. He does not have any smokeless tobacco history on file. He reports that he does not drink alcohol or use illicit drugs.  Allergies: No Known Allergies  Medications Prior to Admission  Medication Sig Dispense Refill  . HYDROcodone-acetaminophen (NORCO/VICODIN) 5-325 MG per tablet Take 1 tablet by mouth every 6 (six) hours as needed for pain.      [name] lisinopril (PRINIVIL,ZESTRIL) 20 MG tablet Take 1 tablet (20 mg total) by mouth daily.  30 tablet  3  . simvastatin (ZOCOR) 10 MG tablet Take 1 tablet (10 mg total) by mouth at bedtime.  30 tablet  3    No results found for this or any previous visit (from the past 48 hour(s)). No  results found.  ROS  Blood pressure 128/79, pulse 79, temperature 97.8 F (36.6 C), temperature source Oral, resp. rate 18, height 6\' 3"  (1.905 m), weight 395 lb (179.171 kg), SpO2 95.00%. Physical Exam  Constitutional:  Well developed obese African American male in NAD.  HENT:  Mouth/Throat: Oropharynx is clear and moist.  Eyes: Conjunctivae are normal. No scleral icterus.  Neck: No thyromegaly present.  Cardiovascular: Normal rate, regular rhythm and normal heart sounds.   No murmur heard. Respiratory: Effort normal and breath sounds normal.  GI: Soft. He exhibits no distension and no mass. There is no tenderness.  Musculoskeletal: He exhibits no edema.  Lymphadenopathy:    He has no cervical adenopathy.  Neurological: He is alert.  Skin: Skin is warm and dry.     Assessment/Plan Recurrent hematochezia. Diagnostic colonoscopy.  REHMAN,NAJEEB U 01/01/2013, 8:35 AM

## 2013-01-01 NOTE — Op Note (Signed)
COLONOSCOPY PROCEDURE REPORT  PATIENT:  Jesse Mayer  MR#:  [id number] Birthdate:  [birth year], 42 y.o., male Endoscopist:  Dr. Malissa Hippo, MD Referred By:  Dr. Milinda Antis, MD Procedure Date: 01/01/2013  Procedure:   Colonoscopy  Indications:  Patient is 42 year old African American male with intermittent hematochezia. He is undergoing diagnostic colonoscopy. Family history significant for colon carcinoma in paternal grandmother who was in her late 3s at the time of diagnosis.  Informed Consent:  The procedure and risks were reviewed with the patient and informed consent was obtained.  Medications:  Demerol 5 fives0 mg IV Versed 7 mg IV  Description of procedure:  After a digital rectal exam was performed, that colonoscope was advanced from the anus through the rectum and colon to the area of the cecum, ileocecal valve and appendiceal orifice. The cecum was deeply intubated. These structures were well-seen and photographed for the record. From the level of the cecum and ileocecal valve, the scope was slowly and cautiously withdrawn. The mucosal surfaces were carefully surveyed utilizing scope tip to flexion to facilitate fold flattening as needed. The scope was pulled down into the rectum where a thorough exam including retroflexion was performed.  Findings:   Prep satisfactory. Normal mucosa of cecum, hepatic flexure, transverse colon, splenic flexure descending and sigmoid colon. 4 mm polyp ablated while cold biopsy from rectum. Hemorrhoids noted above and below the dentate line.    Therapeutic/Diagnostic Maneuvers Performed:  See above  Complications:  None   Cecal Withdrawal Time:  13  minutes  Impression:  Examination performed to cecum. 4 mm polyp rectal polyp ablated via cold biopsy.   internal/external hemorrhoids felt to be source of patient's intermittent hematochezia.  Recommendations:  Standard instructions given. High fiber diet. I will contact  patient with biopsy results and further recommendations.  REHMAN,NAJEEB U  01/01/2013 9:17 AM  CC: Dr. Milinda Antis, MD & Dr. Bonnetta Barry ref. provider found

## 2013-01-05 ENCOUNTER — Ambulatory Visit: Payer: Managed Care, Other (non HMO) | Attending: Pulmonary Disease | Admitting: Sleep Medicine

## 2013-01-05 ENCOUNTER — Encounter (HOSPITAL_COMMUNITY): Payer: Self-pay | Admitting: Internal Medicine

## 2013-01-05 DIAGNOSIS — G4733 Obstructive sleep apnea (adult) (pediatric): Secondary | ICD-10-CM | POA: Insufficient documentation

## 2013-01-05 DIAGNOSIS — G4730 Sleep apnea, unspecified: Secondary | ICD-10-CM

## 2013-01-07 NOTE — Procedures (Signed)
[facility] Sharniece Gibbon A. Gerilyn Pilgrim, MD     [email or web address]        NAMECLINTON, Jesse Mayer            ACCOUNT NO.:  [id number]  MEDICAL RECORD NO.:  [id number]          PATIENT TYPE:  OUT  LOCATION:  SLEEP LAB                     FACILITY:  [facility]  PHYSICIAN:  Daesia Zylka A. Gerilyn Pilgrim, M.D. DATE OF BIRTH:  07-May-1970  DATE OF STUDY:  01/05/2013                           NOCTURNAL POLYSOMNOGRAM  REFERRING PHYSICIAN:  Ramon Dredge L. Juanetta Gosling, M.D.   INDICATION:  A 42 year old, who presents with obesity, fatigue, and snoring.  MEDICATIONS:  Lisinopril.  EPWORTH SLEEPINESS SCALE:  7.  BMI:  49.  ARCHITECTURAL SUMMARY:  This is a split night recording with initial portion being a diagnostic and 2nd portion a titration recording.  The total recording time is 481 minutes.  Sleep efficiency 70%, sleep latency 66 minutes, REM latency 209 minutes.  RESPIRATORY SUMMARY:  Baseline oxygen saturation is 98, lowest saturation 84 during REM sleep.  Diagnostic AHI is 32.  The patient placed on positive pressure between 5 and 11. Optimal pressures are 10 and 11.  LIMB MOVEMENT SUMMARY:  PLM index 0.  ELECTROCARDIOGRAM SUMMARY:  Average heart rate is 85 with no significant dysrhythmias observed.  IMPRESSION:  Moderate obstructive sleep apnea syndrome, which responds well to CPAP of 10 and 11.  I recommend starting with the lowest pressure that is effective which should be 10.  Thanks for this referral.   Bernese Doffing A. Gerilyn Pilgrim, M.D.    KAD/MEDQ  D:  01/07/2013 09:40:47  T:  01/07/2013 09:53:15  Job:  [id number]

## 2013-01-08 ENCOUNTER — Telehealth (HOSPITAL_COMMUNITY): Payer: Self-pay | Admitting: Dietician

## 2013-01-08 NOTE — Telephone Encounter (Signed)
Pt was a no-show for appointment scheduled for 01/08/2013 at 1500.

## 2013-01-15 ENCOUNTER — Encounter: Payer: Self-pay | Admitting: *Deleted

## 2013-01-23 ENCOUNTER — Ambulatory Visit: Payer: Self-pay | Admitting: Podiatry

## 2013-02-10 ENCOUNTER — Ambulatory Visit: Payer: Self-pay | Admitting: Anesthesiology

## 2013-02-10 DIAGNOSIS — Z01810 Encounter for preprocedural cardiovascular examination: Secondary | ICD-10-CM

## 2013-02-13 ENCOUNTER — Ambulatory Visit: Payer: Self-pay | Admitting: Podiatry

## 2013-02-17 LAB — PATHOLOGY REPORT

## 2013-05-01 ENCOUNTER — Ambulatory Visit: Payer: Managed Care, Other (non HMO) | Admitting: Family Medicine

## 2013-05-08 ENCOUNTER — Ambulatory Visit (INDEPENDENT_AMBULATORY_CARE_PROVIDER_SITE_OTHER): Payer: Managed Care, Other (non HMO) | Admitting: Family Medicine

## 2013-05-08 ENCOUNTER — Encounter: Payer: Self-pay | Admitting: Family Medicine

## 2013-05-08 VITALS — BP 130/88 | HR 84 | Temp 98.6°F | Resp 18 | Ht 76.0 in | Wt >= 6400 oz

## 2013-05-08 DIAGNOSIS — G4730 Sleep apnea, unspecified: Secondary | ICD-10-CM

## 2013-05-08 DIAGNOSIS — I10 Essential (primary) hypertension: Secondary | ICD-10-CM

## 2013-05-08 MED ORDER — SIMVASTATIN 10 MG PO TABS: 10.0000 mg | ORAL_TABLET | Freq: Every day | ORAL | Status: DC

## 2013-05-08 MED ORDER — LISINOPRIL 20 MG PO TABS: 20.0000 mg | ORAL_TABLET | Freq: Every day | ORAL | Status: DC

## 2013-05-08 MED ORDER — PHENTERMINE HCL 37.5 MG PO TABS: 37.5000 mg | ORAL_TABLET | Freq: Every day | ORAL | Status: DC

## 2013-05-08 NOTE — Patient Instructions (Signed)
Start the phentermine once a day We will contact the [facility] current medications F/U 8 week

## 2013-05-10 DIAGNOSIS — E669 Obesity, unspecified: Secondary | ICD-10-CM | POA: Insufficient documentation

## 2013-05-10 MED ORDER — SIMVASTATIN 10 MG PO TABS: 10.0000 mg | ORAL_TABLET | Freq: Every day | ORAL | Status: DC

## 2013-05-10 MED ORDER — LISINOPRIL 20 MG PO TABS: 20.0000 mg | ORAL_TABLET | Freq: Every day | ORAL | Status: DC

## 2013-05-10 NOTE — Assessment & Plan Note (Signed)
He is morbidly obese with risk factors of hypertension and mild hyperlipidemia. His risk factors for coronary artery disease with decreased significantly if he could lose substantial weight. He's had little activity due to  his recent foot surgery but he is now over 400 pounds. We will start him on phentermine and see how he does with this. He has seen nutrition in the past and also has pamphlets of dietary guide to have given him at our last visit

## 2013-05-10 NOTE — Assessment & Plan Note (Signed)
Continue current BP meds, good control

## 2013-05-10 NOTE — Addendum Note (Signed)
Addended by: Vic Blackbird F on: 05/10/2013 08:16 AM   Modules accepted: Orders

## 2013-05-10 NOTE — Progress Notes (Signed)
Patient ID: Jesse Mayer, male   DOB: 07-29-1970, 43 y.o.   MRN: [id number]   Subjective:    Patient ID: Jesse Mayer, male    DOB: 1970/06/01, 43 y.o.   MRN: [id number]  Patient presents for 4 mos follow up  patient here for followup. He is status post his left foot surgery however has gained significant weight from the inactivity and also eating throughout the day. He is very concerned about his weight wants to know if there are other options that he can pursue. He's been taking his blood pressure medication without any difficulties.  Sleep apnea he has a CPAP machine and has been through 2 different mask with difficulty sleeping with them at night. He would like to try the nasal cannula mask as he does he benefit from the use of his sleep apnea machine     Review Of Systems:  GEN- denies fatigue, fever, weight loss,weakness, recent illness HEENT- denies eye drainage, change in vision, nasal discharge, CVS- denies chest pain, palpitations RESP- denies SOB, cough, wheeze ABD- denies N/V, change in stools, abd pain GU- denies dysuria, hematuria, dribbling, incontinence MSK- + joint pain, muscle aches, injury Neuro- denies headache, dizziness, syncope, seizure activity       Objective:    BP 130/88  Pulse 84  Temp(Src) 98.6 F (37 C) (Oral)  Resp 18  Ht 6\' 4"  (1.93 m)  Wt 400 lb (181.439 kg)  BMI 48.71 kg/m2 GEN- NAD, alert and oriented x3 HEENT- PERRL, EOMI, non injected sclera, pink conjunctiva, MMM, oropharynx clear CVS- RRR, no murmur RESP-CTAB EXT- No edema Pulses- Radial 2+        Assessment & Plan:      Problem List Items Addressed This Visit   Unspecified sleep apnea     Will contact [facility] to see if we can get the nasal cannula mask for his CPAP He will benefit greatly from its regular use    Morbid obesity - Primary     He is morbidly obese with risk factors of hypertension and mild hyperlipidemia. His risk factors for coronary artery disease  with decreased significantly if he could lose substantial weight. He's had little activity due to  his recent foot surgery but he is now over 400 pounds. We will start him on phentermine and see how he does with this. He has seen nutrition in the past and also has pamphlets of dietary guide to have given him at our last visit    Relevant Medications      phentermine (ADIPEX-P) 37.5 MG tablet   HTN (hypertension)     Continue current BP meds, good control    Relevant Medications      lisinopril (PRINIVIL,ZESTRIL) tablet      simvastatin (ZOCOR) tablet      Note: This dictation was prepared with Dragon dictation along with smaller phrase technology. Any transcriptional errors that result from this process are unintentional.

## 2013-05-10 NOTE — Assessment & Plan Note (Signed)
Will contact [facility] to see if we can get the nasal cannula mask for his CPAP He will benefit greatly from its regular use

## 2013-05-14 ENCOUNTER — Telehealth: Payer: Self-pay | Admitting: *Deleted

## 2013-05-14 NOTE — Telephone Encounter (Signed)
Respiratory therapist will contact pt in reference to nasal canula for CPAP and have him to in for fitting due to different types and styles to choice from.

## 2013-07-06 ENCOUNTER — Encounter (HOSPITAL_COMMUNITY): Payer: Self-pay | Admitting: Emergency Medicine

## 2013-07-06 ENCOUNTER — Ambulatory Visit: Payer: Managed Care, Other (non HMO) | Admitting: Family Medicine

## 2013-07-06 ENCOUNTER — Emergency Department (HOSPITAL_COMMUNITY): Payer: Managed Care, Other (non HMO)

## 2013-07-06 ENCOUNTER — Emergency Department (HOSPITAL_COMMUNITY)
Admission: EM | Admit: 2013-07-06 | Discharge: 2013-07-07 | Disposition: A | Discharge status: Home / Self Care | Payer: Managed Care, Other (non HMO) | Attending: Emergency Medicine | Admitting: Emergency Medicine

## 2013-07-06 DIAGNOSIS — F411 Generalized anxiety disorder: Secondary | ICD-10-CM | POA: Insufficient documentation

## 2013-07-06 DIAGNOSIS — Z79899 Other long term (current) drug therapy: Secondary | ICD-10-CM | POA: Insufficient documentation

## 2013-07-06 DIAGNOSIS — F17200 Nicotine dependence, unspecified, uncomplicated: Secondary | ICD-10-CM | POA: Insufficient documentation

## 2013-07-06 DIAGNOSIS — I10 Essential (primary) hypertension: Secondary | ICD-10-CM | POA: Insufficient documentation

## 2013-07-06 DIAGNOSIS — M25579 Pain in unspecified ankle and joints of unspecified foot: Secondary | ICD-10-CM | POA: Insufficient documentation

## 2013-07-06 DIAGNOSIS — M25569 Pain in unspecified knee: Secondary | ICD-10-CM | POA: Insufficient documentation

## 2013-07-06 DIAGNOSIS — Z9889 Other specified postprocedural states: Secondary | ICD-10-CM | POA: Insufficient documentation

## 2013-07-06 DIAGNOSIS — G8911 Acute pain due to trauma: Secondary | ICD-10-CM | POA: Insufficient documentation

## 2013-07-06 DIAGNOSIS — F329 Major depressive disorder, single episode, unspecified: Secondary | ICD-10-CM | POA: Insufficient documentation

## 2013-07-06 DIAGNOSIS — M7989 Other specified soft tissue disorders: Secondary | ICD-10-CM | POA: Insufficient documentation

## 2013-07-06 DIAGNOSIS — R609 Edema, unspecified: Secondary | ICD-10-CM | POA: Insufficient documentation

## 2013-07-06 DIAGNOSIS — F3289 Other specified depressive episodes: Secondary | ICD-10-CM | POA: Insufficient documentation

## 2013-07-06 LAB — BASIC METABOLIC PANEL
BUN: 15 mg/dL (ref 6–23)
CO2: 29 mEq/L (ref 19–32)
CREATININE: 1.04 mg/dL (ref 0.50–1.35)
Calcium: 9.1 mg/dL (ref 8.4–10.5)
Chloride: 101 mEq/L (ref 96–112)
GFR calc non Af Amer: 88 mL/min — ABNORMAL LOW (ref >90)
Glucose, Bld: 115 mg/dL — ABNORMAL HIGH (ref 70–99)
POTASSIUM: 4.2 meq/L (ref 3.7–5.3)
SODIUM: 141 meq/L (ref 137–147)

## 2013-07-06 LAB — URIC ACID: Uric Acid, Serum: 7.9 mg/dL — ABNORMAL HIGH (ref 4.0–7.8)

## 2013-07-06 LAB — D-DIMER, QUANTITATIVE (NOT AT ARMC): D-Dimer, Quant: 0.33 ug/mL-FEU (ref 0.00–0.48)

## 2013-07-06 MED ORDER — OXYCODONE-ACETAMINOPHEN 5-325 MG PO TABS
ORAL_TABLET | ORAL | Status: AC
  Filled 2013-07-06: qty 1

## 2013-07-06 MED ORDER — OXYCODONE-ACETAMINOPHEN 5-325 MG PO TABS
1.0000 | ORAL_TABLET | Freq: Once | ORAL | Status: AC
  Administered 2013-07-06: 1 via ORAL

## 2013-07-06 NOTE — ED Notes (Signed)
I am having pain and swelling in my left lower leg and foot. I had surgery on my left foot in November and was out of work for 3 months, just went back to work on February 26 th and I have been having pain and swelling since. I have not been to see a doctor about it since it started swelling per pt.

## 2013-07-06 NOTE — ED Notes (Signed)
Pt states he has been on feet must of the day while working. Swelling will go down w/ elevation.

## 2013-07-06 NOTE — ED Provider Notes (Signed)
CSN: [id number]     Arrival date & time 07/06/13  1958 History   First MD Initiated Contact with Patient 07/06/13 2048     Chief Complaint  Patient presents with  . Foot Pain     (Consider location/radiation/quality/duration/timing/severity/associated sxs/prior Treatment) HPI Comments: Jesse Mayer is a 43 y.o. Male presenting with pain and swelling of his left lower leg,  Foot and ankle which has been slowly progressive and worsening over the past 6 weeks since returning to work. He underwent a tarsal revision and achilles lengthening surgery 11/14 by Dr. 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 Since returning to work 6 weeks ago (has to stand for 8+ hours) he has developed swelling and pain which worsens the longer he weight bears.Jesse Mayer  He rests and elevates the extremity as much as possible on the weekends but the symptoms continue to worsen.  He denies any injury to the extremity,  Also denies fevers,chills, chest pain and shortness of breath.  He takes no medications for his symptoms.  He is scheduled to see Dr. Vickki Muff next month for his next post surgical exam.     The history is provided by the patient and the spouse.    Past Medical History  Diagnosis Date  . Hypertension   . Anxiety   . Depression   . Substance abuse     in remission   Past Surgical History  Procedure Laterality Date  . Tonsillectomy      childhood  . Htn    . Colonoscopy N/A 01/01/2013    Procedure: COLONOSCOPY;  Surgeon: Rogene Houston, MD;  Location: [facility];  Service: Endoscopy;  Laterality: N/A;  830  . Foot surgery     Family History  Problem Relation Age of Onset  . Depression Maternal Grandfather   . Dementia Maternal Grandfather     and MGGM, his mother  . OCD Mother   . Alcohol abuse Maternal Uncle   . Drug abuse Maternal Uncle   . ADD / ADHD Neg Hx   . Anxiety disorder Neg Hx   . Bipolar disorder Neg Hx   .  Paranoid behavior Neg Hx   . Schizophrenia Neg Hx   . Seizures Neg Hx   . Sexual abuse Neg Hx   . Physical abuse Neg Hx   . Cancer Paternal Grandmother     colon cancer  . Colon cancer Paternal Grandmother    History  Substance Use Topics  . Smoking status: Current Every Day Smoker -- 1.00 packs/day for 20 years    Types: Cigarettes  . Smokeless tobacco: Not on file     Comment: 1 pack every 3rd day  . Alcohol Use: No     Comment: only one beer in January 2013    Review of Systems  Constitutional: Negative for fever and chills.  Respiratory: Negative for cough and shortness of breath.   Cardiovascular: Positive for leg swelling. Negative for chest pain and palpitations.  Musculoskeletal: Positive for arthralgias and joint swelling. Negative for myalgias.  Neurological: Negative for weakness and numbness.      Allergies  Review of patient's allergies indicates no known allergies.  Home Medications   Current Outpatient Rx  Name  Route  Sig  Dispense  Refill  . hydrOXYzine (VISTARIL) 100 MG capsule   Oral   Take 100 mg by mouth at bedtime.         Jesse Mayer lisinopril (  PRINIVIL,ZESTRIL) 20 MG tablet   Oral   Take 20 mg by mouth at bedtime.         . phentermine (ADIPEX-P) 37.5 MG tablet   Oral   Take 1 tablet (37.5 mg total) by mouth daily before breakfast.   30 tablet   1   . risperiDONE (RISPERDAL) 2 MG tablet   Oral   Take 2 mg by mouth at bedtime.         . simvastatin (ZOCOR) 10 MG tablet   Oral   Take 1 tablet (10 mg total) by mouth at bedtime.   30 tablet   3   . HYDROcodone-acetaminophen (NORCO/VICODIN) 5-325 MG per tablet   Oral   Take 2 tablets by mouth every 4 (four) hours as needed.   10 tablet   0   . naproxen (NAPROSYN) 500 MG tablet   Oral   Take 1 tablet (500 mg total) by mouth 2 (two) times daily.   30 tablet   0    BP 122/62  Pulse 96  Temp(Src) 98.7 F (37.1 C) (Oral)  Resp 20  Ht 6' 3.5" (1.918 m)  Wt 400 lb (181.439 kg)   BMI 49.32 kg/m2  SpO2 96% Physical Exam  Constitutional: He appears well-developed and well-nourished.  HENT:  Head: Atraumatic.  Neck: Normal range of motion.  Cardiovascular:  Pulses equal bilaterally  Musculoskeletal: He exhibits edema and tenderness.  TTP left ankle with moderate edema over both medial and lateral malleolus.  Well healed medial ankle  And posterior calf surgical incision.  Nonpitting edema noted to upper ankle.  No erythema. No palpable cords.  Dorsalis pedis pulses equal.  Positive Homan's sign.  Neurological: He is alert. He has normal strength. He displays normal reflexes. No sensory deficit.  Skin: Skin is warm and dry.  Psychiatric: He has a normal mood and affect.    ED Course  Procedures (including critical care time) Labs Review Labs Reviewed  BASIC METABOLIC PANEL - Abnormal; Notable for the following:    Glucose, Bld 115 (*)    GFR calc non Af Amer 88 (*)    All other components within normal limits  URIC ACID - Abnormal; Notable for the following:    Uric Acid, Serum 7.9 (*)    All other components within normal limits  D-DIMER, QUANTITATIVE   Imaging Review Dg Ankle Complete Left  07/06/2013   CLINICAL DATA:  Ankle surgery in 2014.  Extreme pain  EXAM: LEFT ANKLE COMPLETE - 3+ VIEW  COMPARISON:  None.  FINDINGS: There is no acute fracture or dislocation. There is generalized soft tissue swelling around the left ankle. The ankle mortise is intact.  IMPRESSION: No acute osseous injury of the left ankle.   Electronically Signed   By: Kathreen Devoid   On: 07/06/2013 22:29     EKG Interpretation None      MDM   Final diagnoses:  Ankle pain  Dependent edema   Patients labs and/or radiological studies were viewed and considered during the medical decision making and disposition process.   Pt was also seen by Dr. Jeneen Rinks during this visit.  Will plan [place] if d dimer positive. If negative,  Patient most likely has dependent edema Exam and history is  most c/w this condition.  He was prescribed naproxen, hydrocodone.  Also was recommended teds hose, elevation.  [name], PA-C 07/07/13 0201  [name], PA-C 07/07/13 [phone]

## 2013-07-06 NOTE — ED Provider Notes (Signed)
Patient seen and evaluated. History reviewed. Surgery November. Immobilization followed by rehabilitation until the end of January. Back for 6 weeks ago. Progressive pain and swelling since that time. Next followup appointment with orthopedist as still several weeks. Increasing pain. 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 Exam shows diffuse dependent edema without cording. No increase in circumference of the leg above the ankle. Negative d-dimer. Uric acid pending for rule out gout. I think his symptoms are consistent with dependent edema. Plan will be anti-inflammatories, elevation, TED stockings, followup with your primary care physician and your orthopedic surgeon.  Tanna Furry, MD 07/06/13 [phone]

## 2013-07-07 MED ORDER — NAPROXEN 500 MG PO TABS: 500.0000 mg | ORAL_TABLET | Freq: Two times a day (BID) | ORAL | Status: DC

## 2013-07-07 MED ORDER — HYDROCODONE-ACETAMINOPHEN 5-325 MG PO TABS: 2.0000 | ORAL_TABLET | ORAL | Status: DC | PRN

## 2013-07-07 NOTE — ED Notes (Signed)
Pt given discharge papers & prescriptions. Pt carried out by wheelchair. No questions at discharge.

## 2013-07-07 NOTE — ED Provider Notes (Signed)
Medical screening examination/treatment/procedure(s) were conducted as a shared visit with non-physician practitioner(s) and myself.  I personally evaluated the patient during the encounter.   EKG Interpretation None      Patient seen examined. History. Exam performed. I discussed the findings and the studies with the patient. He was discharged to myself. Please see my above note.   Tanna Furry, MD 07/07/13 1535

## 2013-07-07 NOTE — Discharge Instructions (Signed)
Wear a TED hose on your left leg to help swelling. (Available at any pharmacy). Call your orthopedist for recheck. Elevate your feet whenever possible. Prescription for naproxen, an anti-inflammatory.  Edema Edema is an abnormal build-up of fluids in tissues. Because this is partly dependent on gravity (water flows to the lowest place), it is more common in the legs and thighs (lower extremities). It is also common in the looser tissues, like around the eyes. Painless swelling of the feet and ankles is common and increases as a person ages. It may affect both legs and may include the calves or even thighs. When squeezed, the fluid may move out of the affected area and may leave a dent for a few moments. CAUSES   Prolonged standing or sitting in one place for extended periods of time. Movement helps pump tissue fluid into the veins, and absence of movement prevents this, resulting in edema.  Varicose veins. The valves in the veins do not work as well as they should. This causes fluid to leak into the tissues.  Fluid and salt overload.  Injury, burn, or surgery to the leg, ankle, or foot, may damage veins and allow fluid to leak out.  Sunburn damages vessels. Leaky vessels allow fluid to go out into the sunburned tissues.  Allergies (from insect bites or stings, medications or chemicals) cause swelling by allowing vessels to become leaky.  Protein in the blood helps keep fluid in your vessels. Low protein, as in malnutrition, allows fluid to leak out.  Hormonal changes, including pregnancy and menstruation, cause fluid retention. This fluid may leak out of vessels and cause edema.  Medications that cause fluid retention. Examples are sex hormones, blood pressure medications, steroid treatment, or anti-depressants.  Some illnesses cause edema, especially heart failure, kidney disease, or liver disease.  Surgery that cuts veins or lymph nodes, such as surgery done for the heart or for breast  cancer, may result in edema. DIAGNOSIS  Your caregiver is usually easily able to determine what is causing your swelling (edema) by simply asking what is wrong (getting a history) and examining you (doing a physical). Sometimes x-rays, EKG (electrocardiogram or heart tracing), and blood work may be done to evaluate for underlying medical illness. TREATMENT  General treatment includes:  Leg elevation (or elevation of the affected body part).  Restriction of fluid intake.  Prevention of fluid overload.  Compression of the affected body part. Compression with elastic bandages or support stockings squeezes the tissues, preventing fluid from entering and forcing it back into the blood vessels.  Diuretics (also called water pills or fluid pills) pull fluid out of your body in the form of increased urination. These are effective in reducing the swelling, but can have side effects and must be used only under your caregiver's supervision. Diuretics are appropriate only for some types of edema. The specific treatment can be directed at any underlying causes discovered. Heart, liver, or kidney disease should be treated appropriately. HOME CARE INSTRUCTIONS   Elevate the legs (or affected body part) above the level of the heart, while lying down.  Avoid sitting or standing still for prolonged periods of time.  Avoid putting anything directly under the knees when lying down, and do not wear constricting clothing or garters on the upper legs.  Exercising the legs causes the fluid to work back into the veins and lymphatic channels. This may help the swelling go down.  The pressure applied by elastic bandages or support stockings can help reduce  ankle swelling.  A low-salt diet may help reduce fluid retention and decrease the ankle swelling.  Take any medications exactly as prescribed. SEEK MEDICAL CARE IF:  Your edema is not responding to recommended treatments. SEEK IMMEDIATE MEDICAL CARE IF:    You develop shortness of breath or chest pain.  You cannot breathe when you lay down; or if, while lying down, you have to get up and go to the window to get your breath.  You are having increasing swelling without relief from treatment.  You develop a fever over 102 F (38.9 C).  You develop pain or redness in the areas that are swollen.  Tell your caregiver right away if you have gained 03 lb/1.4 kg in 1 day or 05 lb/2.3 kg in a week. MAKE SURE YOU:   Understand these instructions.  Will watch your condition.  Will get help right away if you are not doing well or get worse. Document Released: 03/12/2005 Document Revised: 09/11/2011 Document Reviewed: 10/29/2007 [facility] Patient Information 2014 [place].  Ankle Pain Ankle pain is a common symptom. The bones, cartilage, tendons, and muscles of the ankle joint perform a lot of work each day. The ankle joint holds your body weight and allows you to move around. Ankle pain can occur on either side or back of 1 or both ankles. Ankle pain may be sharp and burning or dull and aching. There may be tenderness, stiffness, redness, or warmth around the ankle. The pain occurs more often when a person walks or puts pressure on the ankle. CAUSES  There are many reasons ankle pain can develop. It is important to work with your caregiver to identify the cause since many conditions can impact the bones, cartilage, muscles, and tendons. Causes for ankle pain include:  Injury, including a break (fracture), sprain, or strain often due to a fall, sports, or a high-impact activity.  Swelling (inflammation) of a tendon (tendonitis).  Achilles tendon rupture.  Ankle instability after repeated sprains and strains.  Poor foot alignment.  Pressure on a nerve (tarsal tunnel syndrome).  Arthritis in the ankle or the lining of the ankle.  Crystal formation in the ankle (gout or pseudogout). DIAGNOSIS  A diagnosis is based on your medical  history, your symptoms, results of your physical exam, and results of diagnostic tests. Diagnostic tests may include X-ray exams or a computerized magnetic scan (magnetic resonance imaging, MRI). TREATMENT  Treatment will depend on the cause of your ankle pain and may include:  Keeping pressure off the ankle and limiting activities.  Using crutches or other walking support (a cane or brace).  Using rest, ice, compression, and elevation.  Participating in physical therapy or home exercises.  Wearing shoe inserts or special shoes.  Losing weight.  Taking medications to reduce pain or swelling or receiving an injection.  Undergoing surgery. HOME CARE INSTRUCTIONS   Only take over-the-counter or prescription medicines for pain, discomfort, or fever as directed by your caregiver.  Put ice on the injured area.  Put ice in a plastic bag.  Place a towel between your skin and the bag.  Leave the ice on for 15-20 minutes at a time, 03-04 times a day.  Keep your leg raised (elevated) when possible to lessen swelling.  Avoid activities that cause ankle pain.  Follow specific exercises as directed by your caregiver.  Record how often you have ankle pain, the location of the pain, and what it feels like. This information may be helpful to you  and your caregiver.  Ask your caregiver about returning to work or sports and whether you should drive.  Follow up with your caregiver for further examination, therapy, or testing as directed. SEEK MEDICAL CARE IF:   Pain or swelling continues or worsens beyond 1 week.  You have an oral temperature above 102 F (38.9 C).  You are feeling unwell or have chills.  You are having an increasingly difficult time with walking.  You have loss of sensation or other new symptoms.  You have questions or concerns. MAKE SURE YOU:   Understand these instructions.  Will watch your condition.  Will get help right away if you are not doing well or  get worse. Document Released: 08/30/2009 Document Revised: 06/04/2011 Document Reviewed: 08/30/2009 [facility] Patient Information 2014 [place].

## 2013-07-07 NOTE — ED Provider Notes (Signed)
D-dimer is normal. Uric acid minimally high. Think this is supple dependent edema. Plan will be TED hose, elevation, anti-inflammatories. He has an appointment with his primary care physician on Wednesday. I have asked him to call his orthopedist for a followup appointment.  Tanna Furry, MD 07/07/13 [phone]

## 2013-07-08 ENCOUNTER — Encounter: Payer: Self-pay | Admitting: Family Medicine

## 2013-07-08 ENCOUNTER — Ambulatory Visit (INDEPENDENT_AMBULATORY_CARE_PROVIDER_SITE_OTHER): Payer: Managed Care, Other (non HMO) | Admitting: Family Medicine

## 2013-07-08 VITALS — BP 132/74 | HR 88 | Temp 97.6°F | Resp 16 | Ht 74.0 in

## 2013-07-08 DIAGNOSIS — N529 Male erectile dysfunction, unspecified: Secondary | ICD-10-CM

## 2013-07-08 DIAGNOSIS — F333 Major depressive disorder, recurrent, severe with psychotic symptoms: Secondary | ICD-10-CM

## 2013-07-08 DIAGNOSIS — F411 Generalized anxiety disorder: Secondary | ICD-10-CM

## 2013-07-08 LAB — TESTOSTERONE: Testosterone: 371 ng/dL (ref 300–890)

## 2013-07-08 MED ORDER — RISPERIDONE 2 MG PO TABS: 2.0000 mg | ORAL_TABLET | Freq: Every day | ORAL | Status: DC

## 2013-07-08 MED ORDER — HYDROXYZINE PAMOATE 100 MG PO CAPS: 100.0000 mg | ORAL_CAPSULE | Freq: Every day | ORAL | Status: DC

## 2013-07-08 MED ORDER — CITALOPRAM HYDROBROMIDE 10 MG PO TABS: 10.0000 mg | ORAL_TABLET | Freq: Every day | ORAL | Status: DC

## 2013-07-08 NOTE — Progress Notes (Signed)
Patient ID: Jesse Mayer, male   DOB: 1971-02-07, 43 y.o.   MRN: [id number]   Subjective:    Patient ID: Jesse Mayer, male    DOB: 12/11/1970, 43 y.o.   MRN: [id number]  Patient presents for 8 week F/U and personal issue  Pt here to f/u medications. He has had some increase stress, anxiety and panic attacks past few weeks. He has felt down and stressed because of his ongoing left foot pain, podiatry told him he would need a second surgery due to pain. He also notes this is 4 years he has been clean from substance abuse but seems to have a PTSD moment near that date in March. He felt him self "slipping" into a bad anxiety and panic state and restarted his risperdal and hydroxzyine, he discussed with his wife and wants to continue taking.   ED past 6 months, has difficulty getting and sustaining erection. He does have interest in sex.   Obesity- unable to get weight in the clinic, he thinks he has lost about 15lbs, his clothing is is much looser    Review Of Systems:  GEN- denies fatigue, fever, weight loss,weakness, recent illness HEENT- denies eye drainage, change in vision, nasal discharge, CVS- denies chest pain, palpitations RESP- denies SOB, cough, wheeze ABD- denies N/V, change in stools, abd pain GU- denies dysuria, hematuria, dribbling, incontinence MSK- + joint pain, muscle aches, injury Neuro- denies headache, dizziness, syncope, seizure activity       Objective:    BP 132/74  Pulse 88  Temp(Src) 97.6 F (36.4 C) (Oral)  Resp 16  Ht 6\' 2"  (1.88 m) GEN- NAD, alert and oriented x3 Psych- depressed affect, not anxious appearing, good eye contact, tearful,well groomed         Assessment & Plan:      Problem List Items Addressed This Visit   Morbid obesity     Weight loss by inches, unable to weigh still     Major depressive disorder, recurrent episode, severe, with psychosis - Primary     Restart celexa 10mg  Continue atarax and risperdone Declines  therapy     Relevant Medications      citalopram (CELEXA) tablet      hydrOXYzine (VISTARIL) capsule   Generalized anxiety disorder     Continue atarax     Other Visit Diagnoses   Erectile dysfunction        I think is MTF, with his mood, obesity HTN, meds SE, check testosterone, hold on treatment     Relevant Orders       Testosterone (Completed)       Note: This dictation was prepared with Dragon dictation along with smaller phrase technology. Any transcriptional errors that result from this process are unintentional.

## 2013-07-08 NOTE — Assessment & Plan Note (Signed)
Weight loss by inches, unable to weigh still

## 2013-07-08 NOTE — Patient Instructions (Signed)
Start the celexa 10mg  Continue all other medications F/U 4 weeks

## 2013-07-08 NOTE — Assessment & Plan Note (Addendum)
Restart celexa 10mg  Continue atarax and risperdone Declines therapy   Approx 30 minutes spent with pt > 50% of time spent on counseling for depression and medications

## 2013-07-08 NOTE — Assessment & Plan Note (Signed)
Continue atarax

## 2013-07-13 ENCOUNTER — Encounter: Payer: Self-pay | Admitting: *Deleted

## 2013-08-03 ENCOUNTER — Ambulatory Visit (INDEPENDENT_AMBULATORY_CARE_PROVIDER_SITE_OTHER): Payer: Managed Care, Other (non HMO) | Admitting: Family Medicine

## 2013-08-03 ENCOUNTER — Encounter: Payer: Self-pay | Admitting: Family Medicine

## 2013-08-03 VITALS — BP 138/80 | HR 82 | Temp 98.5°F | Resp 16 | Ht 74.0 in | Wt >= 6400 oz

## 2013-08-03 DIAGNOSIS — I4510 Unspecified right bundle-branch block: Secondary | ICD-10-CM

## 2013-08-03 DIAGNOSIS — F411 Generalized anxiety disorder: Secondary | ICD-10-CM

## 2013-08-03 DIAGNOSIS — I10 Essential (primary) hypertension: Secondary | ICD-10-CM

## 2013-08-03 DIAGNOSIS — Z01818 Encounter for other preprocedural examination: Secondary | ICD-10-CM

## 2013-08-03 DIAGNOSIS — F333 Major depressive disorder, recurrent, severe with psychotic symptoms: Secondary | ICD-10-CM

## 2013-08-03 NOTE — Progress Notes (Signed)
Patient ID: Jesse Mayer, male   DOB: 25-Feb-1971, 43 y.o.   MRN: [id number]   Subjective:    Patient ID: Jesse Mayer, male    DOB: [birth year], 43 y.o.   MRN: [id number]  Patient presents for 4 week F/U  Patient here for interim visit on his medications. Her last visit he was experiencing some depression as well as racing thoughts. I restarted him on Celexa 10 mg as well as hydroxyzine and Risperdal. He took the Celexa for a little over a week states that he felt very good on the medication however was keeping him up at nighttime. He's not sure if he could take it during the day. He is doing well with a Risperdal in the Atarax. He's not having the racing thoughts and overall feels much better. He understands that he needs to be on his medications at this time.  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 He is still in a postoperative boot. He is surgery is scheduled for this Friday. He has a form that I need to complete today    Review Of Systems:  GEN- denies fatigue, fever, weight loss,weakness, recent illness HEENT- denies eye drainage, change in vision, nasal discharge, CVS- denies chest pain, palpitations RESP- denies SOB, cough, wheeze ABD- denies N/V, change in stools, abd pain GU- denies dysuria, hematuria, dribbling, incontinence MSK- + joint pain, muscle aches, injury Neuro- denies headache, dizziness, syncope, seizure activity       Objective:    BP 138/80  Pulse 82  Temp(Src) 98.5 F (36.9 C) (Oral)  Resp 16  Ht 6\' 2"  (1.88 m)  Wt 400 lb (181.439 kg)  BMI 51.34 kg/m2 GEN- NAD, alert and oriented x3, obese  HEENT- PERRL, EOMI, non injected sclera, pink conjunctiva, MMM, oropharynx clear Neck- Supple,  CVS- RRR- HR 90 , no murmur RESP-CTAB ABD-NABS,soft,NT,ND EXT-+ left foot/ankle edema, no edema right LE Pulses- Radial, DP- 2+ PSYCH- normal affect and  mood  EKG- sinus tachycardia HR 108, incomplete RBBB,  QRS 102       Assessment & Plan:      Problem List Items Addressed This Visit   Preoperative examination     Cleared for surgical intervention on foot See scanned form    Major depressive disorder, recurrent episode, severe, with psychosis - Primary     I will have him take the Celexa in the morning he would continue the hydroxyzine and Risperdol at current dose    Incomplete right bundle branch block     QRS is minimally prolonged at 102 ms, is not having chest pain or shortness of breath. He does have known obstructive sleep apnea. I do not think that this should keep him from having his foot surgery.    HTN (hypertension)     Pressure is well-controlled his EKG does show an incomplete right bundle branch block I do not have anything to compare with however the previous surgery Center does have an EKG and will have them forward this to me if there is any concerns. I think that he can undergo the foot surgery    Generalized anxiety disorder     Symptoms improved       Note: This dictation was prepared with Dragon dictation along with smaller phrase technology. Any transcriptional errors that result from this process are unintentional.

## 2013-08-03 NOTE — Assessment & Plan Note (Signed)
Symptoms improved

## 2013-08-03 NOTE — Assessment & Plan Note (Signed)
Pressure is well-controlled his EKG does show an incomplete right bundle branch block I do not have anything to compare with however the previous surgery Center does have an EKG and will have them forward this to me if there is any concerns. I think that he can undergo the foot surgery

## 2013-08-03 NOTE — Assessment & Plan Note (Signed)
QRS is minimally prolonged at 102 ms, is not having chest pain or shortness of breath. He does have known obstructive sleep apnea. I do not think that this should keep him from having his foot surgery.

## 2013-08-03 NOTE — Patient Instructions (Signed)
Take celexa in the morning Continue all other medication  F/U as 3 months

## 2013-08-03 NOTE — Assessment & Plan Note (Signed)
Cleared for surgical intervention on foot See scanned form

## 2013-08-03 NOTE — Assessment & Plan Note (Signed)
I will have him take the Celexa in the morning he would continue the hydroxyzine and Risperdol at current dose

## 2013-08-06 ENCOUNTER — Telehealth: Payer: Self-pay | Admitting: *Deleted

## 2013-08-06 NOTE — Telephone Encounter (Signed)
naproxen (NAPROSYN) 500 MG tablet take one tablet by mouth twice daily #30  ? Ok to refill

## 2013-08-07 MED ORDER — NAPROXEN 500 MG PO TABS: 500.0000 mg | ORAL_TABLET | Freq: Two times a day (BID) | ORAL | Status: DC

## 2013-08-07 NOTE — Telephone Encounter (Signed)
Medication refilled

## 2013-08-20 ENCOUNTER — Encounter: Payer: Self-pay | Admitting: Family Medicine

## 2013-09-03 ENCOUNTER — Ambulatory Visit: Payer: Self-pay | Admitting: Podiatry

## 2013-11-03 ENCOUNTER — Ambulatory Visit: Payer: Managed Care, Other (non HMO) | Admitting: Family Medicine

## 2013-11-28 ENCOUNTER — Encounter (HOSPITAL_COMMUNITY): Payer: Self-pay | Admitting: Emergency Medicine

## 2013-11-28 ENCOUNTER — Emergency Department (HOSPITAL_COMMUNITY)
Admission: EM | Admit: 2013-11-28 | Discharge: 2013-11-28 | Disposition: A | Discharge status: Home / Self Care | Payer: Managed Care, Other (non HMO) | Attending: Emergency Medicine | Admitting: Emergency Medicine

## 2013-11-28 DIAGNOSIS — F3289 Other specified depressive episodes: Secondary | ICD-10-CM | POA: Insufficient documentation

## 2013-11-28 DIAGNOSIS — Z791 Long term (current) use of non-steroidal anti-inflammatories (NSAID): Secondary | ICD-10-CM | POA: Insufficient documentation

## 2013-11-28 DIAGNOSIS — F411 Generalized anxiety disorder: Secondary | ICD-10-CM | POA: Insufficient documentation

## 2013-11-28 DIAGNOSIS — I10 Essential (primary) hypertension: Secondary | ICD-10-CM | POA: Insufficient documentation

## 2013-11-28 DIAGNOSIS — M79609 Pain in unspecified limb: Secondary | ICD-10-CM | POA: Insufficient documentation

## 2013-11-28 DIAGNOSIS — L03039 Cellulitis of unspecified toe: Secondary | ICD-10-CM | POA: Insufficient documentation

## 2013-11-28 DIAGNOSIS — F329 Major depressive disorder, single episode, unspecified: Secondary | ICD-10-CM | POA: Insufficient documentation

## 2013-11-28 DIAGNOSIS — Z79899 Other long term (current) drug therapy: Secondary | ICD-10-CM | POA: Insufficient documentation

## 2013-11-28 DIAGNOSIS — F17200 Nicotine dependence, unspecified, uncomplicated: Secondary | ICD-10-CM | POA: Insufficient documentation

## 2013-11-28 DIAGNOSIS — L03032 Cellulitis of left toe: Secondary | ICD-10-CM

## 2013-11-28 MED ORDER — SULFAMETHOXAZOLE-TRIMETHOPRIM 800-160 MG PO TABS: 1.0000 | ORAL_TABLET | Freq: Two times a day (BID) | ORAL | Status: AC

## 2013-11-28 NOTE — ED Notes (Signed)
C/o infected   Left great toe.  Recently had cast removed ? Toe nail rubbed this spot.

## 2013-11-28 NOTE — ED Notes (Signed)
Cleaned toe with sterile water and applied bandaid.

## 2013-11-28 NOTE — Discharge Instructions (Signed)
Please follow directions as provided.  Be sure to follow up with your podiatrist this week to re-check your nail. Start the antibiotic today.    SEEK MEDICAL ATTENTION:  If you have any fever, worsening pain, redness around the toe or red streaking up your foot or any other concerning symptoms.

## 2013-11-28 NOTE — ED Provider Notes (Signed)
Intermittent swelling of the L great toe medial nail surface after having cast removed after ankle fusion (has podiatrist f/u).  On exam has paronychia (mild) which is spontaneously draining with mixed serous and purulent d/c which is draining without incision, no redness, swelling or streaking on the foot, no systemic s/s.  Stable on abx for home.  Medical screening examination/treatment/procedure(s) were conducted as a shared visit with non-physician practitioner(s) and myself.  I personally evaluated the patient during the encounter.  Clinical Impression: Paronychia L great toe.      [name], MD 11/29/13 [phone]

## 2013-11-28 NOTE — ED Provider Notes (Signed)
CSN: [id number]     Arrival date & time 11/28/13  0813 History  This chart was scribed for non-physician practitioner working with [name], MD, by Peyton Bottoms ED Scribe. This patient was seen in room APA12/[facility]12 and the patient's care was started at 8:33 AM   Chief Complaint  Patient presents with  . Toe Pain   Patient is a 43 y.o. male presenting with toe pain. The history is provided by the patient. No language interpreter was used.  Toe Pain Pertinent negatives include no chest pain, no abdominal pain and no shortness of breath.   HPI Comments: Jesse Mayer is a 43 y.o. male who presents to the Emergency Department complaining of moderate infection on medial aspect left toe that began after removal of cast 1 month ago. Patient states that he had left ankle surgery in June and was in a cast for 2 months.  Patient states that his cast was removed 30 days ago. He states that he initially suspected that the toe infection was due to an ingrown nail. Patient states that when he squeezed it, he noticed a drainage with pus-like consistency. Patient states he cut his toe nail with minimal relief or improvement. He states that he has had an open wound with exposed skin to the medial aspect of his big toe on his left foot for the past 1 month. Patient denies any numbness or tingling to affected area. Patient has good sensation and normal joint movement on left foot.   Past Medical History  Diagnosis Date  . Hypertension   . Anxiety   . Depression   . Substance abuse     in remission   Past Surgical History  Procedure Laterality Date  . Tonsillectomy      childhood  . Htn    . Colonoscopy N/A 01/01/2013    Procedure: COLONOSCOPY;  Surgeon: Rogene Houston, MD;  Location: [facility];  Service: Endoscopy;  Laterality: N/A;  830  . Foot surgery     Family History  Problem Relation Age of Onset  . Depression Maternal Grandfather   . Dementia Maternal Grandfather     and MGGM,  his mother  . OCD Mother   . Alcohol abuse Maternal Uncle   . Drug abuse Maternal Uncle   . ADD / ADHD Neg Hx   . Anxiety disorder Neg Hx   . Bipolar disorder Neg Hx   . Paranoid behavior Neg Hx   . Schizophrenia Neg Hx   . Seizures Neg Hx   . Sexual abuse Neg Hx   . Physical abuse Neg Hx   . Cancer Paternal Grandmother     colon cancer  . Colon cancer Paternal Grandmother    History  Substance Use Topics  . Smoking status: Current Every Day Smoker -- 1.00 packs/day for 20 years    Types: Cigarettes  . Smokeless tobacco: Never Used     Comment: 1 pack every 3rd day  . Alcohol Use: 0.6 oz/week    1 Cans of beer per week     Comment: occasionally    Review of Systems  Constitutional: Negative for fever and chills.  HENT: Negative for sore throat.   Eyes: Negative for visual disturbance.  Respiratory: Negative for shortness of breath.   Cardiovascular: Negative for chest pain.  Gastrointestinal: Negative for nausea, vomiting and abdominal pain.  Genitourinary: Negative for dysuria.  Musculoskeletal: Negative for arthralgias and back pain.  Skin: Positive for wound.  Neurological:  Negative for numbness.  Hematological: Does not bruise/bleed easily.  All other systems reviewed and are negative.  Allergies  Review of patient's allergies indicates no known allergies.  Home Medications   Prior to Admission medications   Medication Sig Start Date End Date Taking? Authorizing Provider  citalopram (CELEXA) 10 MG tablet Take 1 tablet (10 mg total) by mouth daily. 07/08/13   Alycia Rossetti, MD  HYDROcodone-acetaminophen (NORCO/VICODIN) 5-325 MG per tablet Take 2 tablets by mouth every 4 (four) hours as needed. 07/07/13   Tanna Furry, MD  hydrOXYzine (VISTARIL) 100 MG capsule Take 1 capsule (100 mg total) by mouth at bedtime. 07/08/13   Alycia Rossetti, MD  lisinopril (PRINIVIL,ZESTRIL) 20 MG tablet Take 20 mg by mouth at bedtime.    Historical Provider, MD  naproxen (NAPROSYN)  500 MG tablet Take 1 tablet (500 mg total) by mouth 2 (two) times daily. 08/07/13   Alycia Rossetti, MD  risperiDONE (RISPERDAL) 2 MG tablet Take 1 tablet (2 mg total) by mouth at bedtime. 07/08/13   Alycia Rossetti, MD  simvastatin (ZOCOR) 10 MG tablet Take 1 tablet (10 mg total) by mouth at bedtime. 05/10/13   Alycia Rossetti, MD   Triage Vitals: BP 136/92  Pulse 84  Temp(Src) 98.5 F (36.9 C) (Oral)  Resp 16  Ht 6' 3.5" (1.918 m)  Wt 400 lb (181.439 kg)  BMI 49.32 kg/m2  SpO2 98%  Physical Exam  Nursing note and vitals reviewed. Constitutional: He appears well-developed and well-nourished. No distress.  HENT:  Head: Normocephalic and atraumatic.  Mouth/Throat: Oropharynx is clear and moist. No oropharyngeal exudate.  Eyes: Conjunctivae and EOM are normal. Pupils are equal, round, and reactive to light. Right eye exhibits no discharge. Left eye exhibits no discharge. No scleral icterus.  Neck: Normal range of motion. Neck supple. No JVD present. No thyromegaly present.  Cardiovascular: Normal rate, regular rhythm, normal heart sounds and intact distal pulses.  Exam reveals no gallop and no friction rub.   No murmur heard. Pulmonary/Chest: Effort normal and breath sounds normal. No respiratory distress. He has no wheezes. He has no rales.  Abdominal: Soft. Bowel sounds are normal. He exhibits no distension and no mass. There is no tenderness.  Musculoskeletal: Normal range of motion. He exhibits no edema and no tenderness.  Good strength. 2+ DP and TP pulses on left foot. No altered sensation in affected toe. No arthralgia. 1 cm long of exposed skin tissue with purulent material directly under skin. Bleeding is controlled. Ecchymosis around perimeter of location of tear. No cellulitis noted.  Lymphadenopathy:    He has no cervical adenopathy.  Neurological: He is alert. Coordination normal.  Skin: Skin is warm and dry. No rash noted. No erythema.  Psychiatric: He has a normal mood  and affect. His behavior is normal.   ED Course  Procedures (including critical care time)  DIAGNOSTIC STUDIES: Oxygen Saturation is 98% on RA, normal by my interpretation.    COORDINATION OF CARE: 8:38 AM- Pt advised of plan for treatment and pt agrees.  Labs Review Labs Reviewed - No data to display  Imaging Review No results found.   EKG Interpretation None      MDM   Final diagnoses:  Paronychia of great toe of left foot   Skin around paronychia is currently draining,  Will prescribe antibiotic to cover MRSA and recommend follow-up this week with pt's podiatrist.  Pt is afebrile, not tachycardic and no signs of worsening infection.  Discharge instructions include information regarding paronychia.  Return precautions include fever, swelling, increased pain, redness around the toe or red streaking up the foot.  Pt is in agreement with plan.    Filed Vitals:   11/28/13 0821  BP: 136/92  Pulse: 84  Temp: 98.5 F (36.9 C)  TempSrc: Oral  Resp: 16  Height: 6' 3.5" (1.918 m)  Weight: 400 lb (181.439 kg)  SpO2: 98%   Meds given in ED:  Medications - No data to display  Discharge Medication List as of 11/28/2013  8:56 AM    START taking these medications   Details  sulfamethoxazole-trimethoprim (BACTRIM DS,SEPTRA DS) 800-160 MG per tablet Take 1 tablet by mouth 2 (two) times daily., Starting 11/28/2013, Last dose on Sat 12/05/13, Print       I personally performed the services described in this documentation, which was scribed in my presence. The recorded information has been reviewed and is accurate.       Britt Bottom, NP 11/28/13 [phone]

## 2013-11-29 NOTE — ED Provider Notes (Signed)
Medical screening examination/treatment/procedure(s) were conducted as a shared visit with non-physician practitioner(s) and myself.  I personally evaluated the patient during the encounter  Please see my separate respective documentation pertaining to this patient encounter   [name], MD 11/29/13 [phone]

## 2013-12-18 ENCOUNTER — Ambulatory Visit: Payer: Self-pay | Admitting: Family Medicine

## 2014-04-20 ENCOUNTER — Encounter: Payer: Self-pay | Admitting: Family Medicine

## 2014-05-28 ENCOUNTER — Encounter: Payer: Self-pay | Admitting: Family Medicine

## 2014-07-16 NOTE — Op Note (Signed)
PATIENT NAME:  Jesse Mayer, Jesse Mayer MR#:  [id number] DATE OF BIRTH:  [birth year]  DATE OF SURGERY:  02/13/2013  PREOPERATIVE DIAGNOSES:  1.  Left foot equinus.  2.  Middle facet tarsal coalition.   POSTOPERATIVE DIAGNOSES: 1.  Left foot equinus.  2.  Middle facet tarsal coalition.   PROCEDURES: 1.  Strayer gastroc recession, left lower leg.  2.  Middle facet tarsal coalition resection, left foot.   SURGEON:  Meher Kucinski A. Vickki Mayer, DPM.   ANESTHESIA:  General with popliteal block.   HEMOSTASIS:  Thigh tourniquet inflated to 350 mmHg for 84 minutes.   COMPLICATIONS:  None.   SPECIMEN:  Tarsal coalition for pathology.   ESTIMATED BLOOD LOSS:  25 mL.   OPERATIVE INDICATIONS:  This is a 44 year old gentleman who I see him in the outpatient clinic with complaint of left foot and ankle pain. He has been diagnosed with a tarsal coalition on MRI without obvious bony bridging on CT scan. No severe arthritic changes were noted. He failed long-standing conservative treatment and presents today for surgical intervention. All risks, benefits, alternatives and complications associated with surgery were discussed with the patient and full informed consent has been given.   OPERATIVE PROCEDURE:  The patient was brought into the OR and placed on operating table in the supine position. General intubation was administered by the anesthesia team. The left lower extremity was then prepped and draped in the usual sterile fashion. Attention was initially directed to the posterior aspect of the leg where the leg was raised up using a shoulder holder. A longitudinal midline incision was made on the posterior aspect of the leg. Sharp and blunt dissection was carried down to the peritenon. At this point, the gastroc recession was performed from the medial to lateral aspect of the aponeurosis. Good excursion was noted of this area. The foot was able to dorsiflex to 90 degrees and just past 90 degrees. The wound was then  flushed with copious amounts of irrigation. Layered skin closure was performed with a 4-0 Vicryl for the deeper layers and a 3-0 nylon for the skin. Next, the tourniquet was then inflated and a medial incision was made after this was mapped out with fluoroscopy. A medial incision was made from the navicular and cuneiform region coursing to just posterior to the medial malleolus. Sharp and blunt dissection was carried down to the deep fascia. A deep fascial incision was made. The flexor digitorum longus and posterior tibial tendon were noted. These were retracted dorsally. The neurovascular bundle was noted and retracted throughout the procedure plantarly. At this time with the use of fluoroscopy, I was able to map out the site of the middle facet. The posterior facet was easily entered in the posterior aspect. At this time a Valora Corporal was used to push towards the middle facet portion. I was able to, with some force, enter into the middle facet with a Freer. With the 15 blade, I was then able to enter through coalition site. There was noted to be some scar tissue with a very narrowed joint space in the region after removal of all the fibrotic tissue in the region. At this time I resected approximately a 0.5 cm portion of the calcaneal portion of the middle facet deep into the subtalar joint. A marked increased of subtalar joint range of motion, which basically was zero prior resection of coalition, to approximately 7 to 10 degrees of inversion and eversion on the table was noted at this time. This was seen with  fluoroscopy. I was able to map out the increased joint space from pre-excision to post excision in a calcaneal axial view. At this time, the wound was then flushed with copious amounts of irrigation. Bone wax was applied to the coalition site. A small amount of fat that had been harvested from the posterior calf was then placed into the coalition site and sutured on the medial aspect to the deeper fascial layers.  All wounds were flushed finally with copious amounts of irrigation. Layered closure was performed with a 3-0 and 4-0 Vicryl for the deeper layers and a 3-0 nylon for skin. A 0.5% Marcaine block was placed around the site. He was then placed in a well compressive sterile dressing and placed in an equalizer walker boot with the foot at 90 degrees. He tolerated the procedure and anesthesia well and was transported from the OR to the PACU with all vital signs stable and neurovascular status intact.   I will see him in the outpatient clinic in 5 to 7 days. He is to contact our office for any questions. We will get him a prescription for Percocet for pain as needed.   ____________________________ Jesse Mayer Vickki Mayer, DPM jaf:jm D: 02/13/2013 15:15:48 ET T: 02/13/2013 16:03:26 ET JOB#: [id number]  cc: Jesse Mayer, DPM, <Dictator> Jesse Mayer DPM ELECTRONICALLY SIGNED 02/24/2013 16:09

## 2014-07-17 NOTE — Op Note (Signed)
PATIENT NAME:  Jesse, Mayer MR#:  [id number] DATE OF BIRTH:  10-05-1970  DATE OF PROCEDURE:  09/03/2013  PREOPERATIVE DIAGNOSIS: Tarsal coalition with subtalar joint arthritis, left foot.   POSTOPERATIVE DAIGNOSIS: Tarsal coalition with subtalar joint arthritis, left foot  PROCEDURE PERFORMED: Subtalar joint fusion, left heel.   SURGEON: Akhila Mahnken A. Vickki Muff, DPM.   ANESTHESIA: General with popliteal block.   HEMOSTASIS: Mid calf tourniquet inflated to 250 mmHg for approximately 90 minutes.   COMPLICATIONS: None.   SPECIMEN: None.   ESTIMATED BLOOD LOSS: Less than 25 mL.  OPERATIVE INDICATIONS: This is a 44 year old gentleman with a complaint of left subtalar joint pain. He has undergone long-standing conservative intervention and presents today for surgery. All risks, benefits, alternatives, and complications associated with the surgery were discussed with the patient and consent has been given.   OPERATIVE PROCEDURE: The patient was brought into the OR and placed on operating table in the supine position. A popliteal block had previously been placed. The left lower extremity was prepped and draped in the usual sterile fashion. After inflation of the tourniquet, attention was directed to the lateral aspect of the subtalar joint where a longitudinal incision was made from the distal fibula overlying the calcaneocuboid joint region. Sharp and blunt dissection was carried down to the EDB muscle belly. This was freed from the subtalar joint and the calcaneus and reflected anteriorly. At this time, the subtalar joint was entered. There was a large amount of scarring within the joint. There was noted inability to move the subtalar joint very freely. I was able to remove a fair amount of the subtalar joint scarring and there was noted arthritic changes specifically of the posterior facet and middle facet regions. With a combination of curettage, bur and drilling, I was able to prepare the joint. A  1.5 mm drill bit was used for the final stage after taking down to good healthy bleeding bone. The guidewires for the 7.0 mm cortical screws from the OrthoHelix tray were used from the posterior heel crossing the subtalar joint. Good alignment in the axial and lateral position were noted. Prior to placement of the screws, Trinity bone graft was placed into the subtalar joint and packed in. Next, two 7.0 mm short threaded screws were placed and lagged across the subtalar joint with good compression and stability noted of this area. Good alignment of the subtalar joint was noted. The heel was in a neutral position to the leg.   At this time, closure was then performed with a 3-0 Vicryl for the deeper layer and 3-0 Vicryl for the subcutaneous tissue. A 3-0 nylon for all skin regions were closed then. Marcaine block 0.5% was placed around the ankle joint. The patient tolerated the procedure and anesthesia well and was transported from the OR to the PACU with all vital signs stable and neurovascular status intact. I will see him in the outpatient clinic in 5 to 7 days. A prescription for Percocet for pain will be given.   ____________________________ [name]. Vickki Muff, DPM jaf:aw D: 09/03/2013 11:19:28 ET T: 09/03/2013 11:26:27 ET JOB#: [id number]  cc: Larkin Ina A. Vickki Muff, DPM, <Dictator> Tylan Kinn DPM ELECTRONICALLY SIGNED 10/23/2013 7:46

## 2014-10-12 ENCOUNTER — Other Ambulatory Visit: Payer: 59

## 2014-10-12 DIAGNOSIS — F17200 Nicotine dependence, unspecified, uncomplicated: Secondary | ICD-10-CM

## 2014-10-12 DIAGNOSIS — E785 Hyperlipidemia, unspecified: Secondary | ICD-10-CM

## 2014-10-12 DIAGNOSIS — Z0000 Encounter for general adult medical examination without abnormal findings: Secondary | ICD-10-CM

## 2014-10-12 DIAGNOSIS — R69 Illness, unspecified: Secondary | ICD-10-CM

## 2014-10-12 DIAGNOSIS — I10 Essential (primary) hypertension: Secondary | ICD-10-CM

## 2014-10-12 LAB — HEMOGLOBIN A1C
Hgb A1c MFr Bld: 6.1 % — ABNORMAL HIGH (ref <5.7)
Mean Plasma Glucose: 128 mg/dL — ABNORMAL HIGH (ref <117)

## 2014-10-12 LAB — CBC WITH DIFFERENTIAL/PLATELET
Basophils Absolute: 0.1 10*3/uL (ref 0.0–0.1)
Basophils Relative: 1 % (ref 0–1)
EOS ABS: 0.3 10*3/uL (ref 0.0–0.7)
Eosinophils Relative: 5 % (ref 0–5)
HCT: 48.4 % (ref 39.0–52.0)
HEMOGLOBIN: 15.9 g/dL (ref 13.0–17.0)
LYMPHS PCT: 43 % (ref 12–46)
Lymphs Abs: 2.8 10*3/uL (ref 0.7–4.0)
MCH: 27.5 pg (ref 26.0–34.0)
MCHC: 32.9 g/dL (ref 30.0–36.0)
MCV: 83.7 fL (ref 78.0–100.0)
MONO ABS: 0.5 10*3/uL (ref 0.1–1.0)
MPV: 8.9 fL (ref 8.6–12.4)
Monocytes Relative: 7 % (ref 3–12)
Neutro Abs: 2.9 10*3/uL (ref 1.7–7.7)
Neutrophils Relative %: 44 % (ref 43–77)
PLATELETS: 386 10*3/uL (ref 150–400)
RBC: 5.78 MIL/uL (ref 4.22–5.81)
RDW: 15.3 % (ref 11.5–15.5)
WBC: 6.6 10*3/uL (ref 4.0–10.5)

## 2014-10-12 LAB — COMPLETE METABOLIC PANEL WITH GFR
ALBUMIN: 3.9 g/dL (ref 3.5–5.2)
ALT: 24 U/L (ref 0–53)
AST: 18 U/L (ref 0–37)
Alkaline Phosphatase: 47 U/L (ref 39–117)
BUN: 14 mg/dL (ref 6–23)
CALCIUM: 9.5 mg/dL (ref 8.4–10.5)
CO2: 27 mEq/L (ref 19–32)
Chloride: 100 mEq/L (ref 96–112)
Creat: 0.74 mg/dL (ref 0.50–1.35)
GFR, Est African American: >89 mL/min
GFR, Est Non African American: >89 mL/min
Glucose, Bld: 99 mg/dL (ref 70–99)
Potassium: 4.8 mEq/L (ref 3.5–5.3)
SODIUM: 137 meq/L (ref 135–145)
Total Bilirubin: 0.4 mg/dL (ref 0.2–1.2)
Total Protein: 7.5 g/dL (ref 6.0–8.3)

## 2014-10-12 LAB — LIPID PANEL
CHOL/HDL RATIO: 6.4 ratio
Cholesterol: 206 mg/dL — ABNORMAL HIGH (ref 0–200)
HDL: 32 mg/dL — ABNORMAL LOW (ref >=40)
LDL Cholesterol: 144 mg/dL — ABNORMAL HIGH (ref 0–99)
Triglycerides: 148 mg/dL (ref <150)
VLDL: 30 mg/dL (ref 0–40)

## 2014-10-12 LAB — TSH: TSH: 0.933 u[IU]/mL (ref 0.350–4.500)

## 2014-10-19 ENCOUNTER — Ambulatory Visit (INDEPENDENT_AMBULATORY_CARE_PROVIDER_SITE_OTHER): Payer: 59 | Admitting: Family Medicine

## 2014-10-19 ENCOUNTER — Encounter: Payer: Self-pay | Admitting: Family Medicine

## 2014-10-19 VITALS — BP 148/86 | HR 86 | Temp 98.7°F | Resp 18 | Ht 76.0 in | Wt 398.0 lb

## 2014-10-19 DIAGNOSIS — Z0000 Encounter for general adult medical examination without abnormal findings: Secondary | ICD-10-CM

## 2014-10-19 DIAGNOSIS — I10 Essential (primary) hypertension: Secondary | ICD-10-CM | POA: Diagnosis not present

## 2014-10-19 DIAGNOSIS — Z720 Tobacco use: Secondary | ICD-10-CM

## 2014-10-19 DIAGNOSIS — F411 Generalized anxiety disorder: Secondary | ICD-10-CM

## 2014-10-19 DIAGNOSIS — E785 Hyperlipidemia, unspecified: Secondary | ICD-10-CM

## 2014-10-19 DIAGNOSIS — Z23 Encounter for immunization: Secondary | ICD-10-CM

## 2014-10-19 DIAGNOSIS — F333 Major depressive disorder, recurrent, severe with psychotic symptoms: Secondary | ICD-10-CM | POA: Diagnosis not present

## 2014-10-19 DIAGNOSIS — R7309 Other abnormal glucose: Secondary | ICD-10-CM | POA: Diagnosis not present

## 2014-10-19 DIAGNOSIS — R7303 Prediabetes: Secondary | ICD-10-CM | POA: Insufficient documentation

## 2014-10-19 MED ORDER — RISPERIDONE 2 MG PO TABS: 2.0000 mg | ORAL_TABLET | Freq: Every day | ORAL | Status: DC

## 2014-10-19 MED ORDER — CITALOPRAM HYDROBROMIDE 10 MG PO TABS: 10.0000 mg | ORAL_TABLET | Freq: Every day | ORAL | Status: DC

## 2014-10-19 MED ORDER — LISINOPRIL 20 MG PO TABS: 20.0000 mg | ORAL_TABLET | Freq: Every day | ORAL | Status: DC

## 2014-10-19 MED ORDER — SIMVASTATIN 20 MG PO TABS: 20.0000 mg | ORAL_TABLET | Freq: Every day | ORAL | Status: DC

## 2014-10-19 NOTE — Patient Instructions (Signed)
Restart all medication except vistaril Referral to nutironist Referral to bariatric clinic Pneumonia vaccine  I recommend dental and eye exam  F/U 6 weeks

## 2014-10-19 NOTE — Progress Notes (Signed)
Patient ID: Jesse Mayer, male   DOB: 05/27/1970, 44 y.o.   MRN: [id number]   Subjective:    Patient ID: Jesse Mayer, male    DOB: 1970/07/05, 44 y.o.   MRN: [id number]  Patient presents for CPE  patient here for physical exam. He has not followed up since last year he's had multiple job changes. He is actually off of all of his medications including his psychiatric medications and these need to be restarted. He is also due for fasting labs he has not had any significant weight loss change and is still around 400 pounds. His last set of labs showed that he was borderline diabetic as well. He wants to discuss with a surgeon regarding some type of weight loss surgery. He continues to smoke about 10-12 cigarettes a day. He is due for pneumonia vaccine.  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    Review Of Systems:  GEN- denies fatigue, fever, weight loss,weakness, recent illness HEENT- denies eye drainage, change in vision, nasal discharge, CVS- denies chest pain, palpitations RESP- denies SOB, cough, wheeze ABD- denies N/V, change in stools, abd pain GU- denies dysuria, hematuria, dribbling, incontinence MSK- +joint pain, muscle aches, injury Neuro- denies headache, dizziness, syncope, seizure activity       Objective:    BP 148/86 mmHg  Pulse 86  Temp(Src) 98.7 F (37.1 C) (Oral)  Resp 18  Ht 6\' 4"  (1.93 m)  Wt 398 lb (180.532 kg)  BMI 48.47 kg/m2 GEN- NAD, alert and oriented x3,obese  HEENT- PERRL, EOMI, non injected sclera, pink conjunctiva, MMM, oropharynx clear Neck- Supple, no thyromegaly, no LAD CVS- RRR, no murmur RESP-CTAB ABD-NABS,soft,NT,ND,large pannus  Psych- Normal affect and mood EXT- No edema Pulses- Radial, DP- 2+        Assessment & Plan:      Problem List Items Addressed This Visit    Tobacco user    Counseling  importance of tobacco cessation      Relevant Orders   Pneumococcal polysaccharide vaccine 23-valent greater than or equal to 42yo subcutaneous/IM (Completed)   Routine general medical examination at a health care facility    CPE done. Fasting labs to be done. A good amount of time was spent regarding his current unhealthy habits and his morbid obesity. He will be referred to the bariatric clinic. He is going to restart his medications.      Relevant Orders   Pneumococcal polysaccharide vaccine 23-valent greater than or equal to 42yo subcutaneous/IM (Completed)   Morbid obesity - Primary    Referral to bariatric clinic, he has tried dieting he is also try weight loss medications with no significant weight loss      Relevant Orders   Pneumococcal polysaccharide vaccine 23-valent greater than or equal to 42yo subcutaneous/IM (Completed)   Mild hyperlipidemia   Relevant Medications   simvastatin (ZOCOR) 20 MG tablet   lisinopril (PRINIVIL,ZESTRIL) 20 MG tablet   Major depressive disorder, recurrent episode, severe, with psychosis    Restart Celexa 10 mg ,restart his risperidone which she does well with for sleep. I will hold off on hydroxyzine for now      Relevant Medications   citalopram (CELEXA) 10 MG tablet   HTN (hypertension)   Relevant Medications   simvastatin (ZOCOR) 20 MG tablet   lisinopril (PRINIVIL,ZESTRIL) 20 MG tablet   Generalized anxiety  disorder   Borderline diabetes    Other Visit Diagnoses    Need for prophylactic vaccination against Streptococcus pneumoniae (pneumococcus)        Relevant Orders    Pneumococcal polysaccharide vaccine 23-valent greater than or equal to 42yo subcutaneous/IM (Completed)       Note: This dictation was prepared with Dragon dictation along with smaller phrase technology. Any transcriptional errors that result from this process are unintentional.

## 2014-10-20 ENCOUNTER — Encounter: Payer: Self-pay | Admitting: Family Medicine

## 2014-10-20 NOTE — Assessment & Plan Note (Signed)
Restart Celexa 10 mg ,restart his risperidone which she does well with for sleep. I will hold off on hydroxyzine for now

## 2014-10-20 NOTE — Assessment & Plan Note (Signed)
CPE done. Fasting labs to be done. A good amount of time was spent regarding his current unhealthy habits and his morbid obesity. He will be referred to the bariatric clinic. He is going to restart his medications.

## 2014-10-20 NOTE — Assessment & Plan Note (Signed)
Referral to bariatric clinic, he has tried dieting he is also try weight loss medications with no significant weight loss

## 2014-10-20 NOTE — Assessment & Plan Note (Signed)
Counseling importance of tobacco cessation

## 2014-11-30 ENCOUNTER — Ambulatory Visit: Payer: 59 | Admitting: Family Medicine

## 2015-08-24 ENCOUNTER — Encounter: Payer: Self-pay | Admitting: Family Medicine

## 2015-08-24 ENCOUNTER — Ambulatory Visit (INDEPENDENT_AMBULATORY_CARE_PROVIDER_SITE_OTHER): Payer: 59 | Admitting: Family Medicine

## 2015-08-24 VITALS — BP 134/74 | HR 72 | Temp 98.5°F | Resp 14 | Ht 76.0 in | Wt 375.0 lb

## 2015-08-24 DIAGNOSIS — F333 Major depressive disorder, recurrent, severe with psychotic symptoms: Secondary | ICD-10-CM | POA: Diagnosis not present

## 2015-08-24 DIAGNOSIS — F5104 Psychophysiologic insomnia: Secondary | ICD-10-CM | POA: Insufficient documentation

## 2015-08-24 DIAGNOSIS — F411 Generalized anxiety disorder: Secondary | ICD-10-CM

## 2015-08-24 DIAGNOSIS — I10 Essential (primary) hypertension: Secondary | ICD-10-CM

## 2015-08-24 DIAGNOSIS — G4700 Insomnia, unspecified: Secondary | ICD-10-CM

## 2015-08-24 MED ORDER — RISPERIDONE 2 MG PO TABS: 2.0000 mg | ORAL_TABLET | Freq: Every day | ORAL | Status: DC

## 2015-08-24 MED ORDER — TEMAZEPAM 15 MG PO CAPS: ORAL_CAPSULE | ORAL | Status: DC

## 2015-08-24 MED ORDER — CITALOPRAM HYDROBROMIDE 10 MG PO TABS: 10.0000 mg | ORAL_TABLET | Freq: Every day | ORAL | Status: DC

## 2015-08-24 NOTE — Assessment & Plan Note (Signed)
Will monitor blood pressure off medications for now he will have fasting labs at her follow-up visit he was congratulated on his weight loss.

## 2015-08-24 NOTE — Patient Instructions (Addendum)
Restart the Celexa in the morning   Risperdal at bedtime  Try the Temazepam for sleep, start with 1 capsule, okay to move up to 2 if needed F/U 6 weeks Come fasting

## 2015-08-24 NOTE — Assessment & Plan Note (Signed)
Restart Celexa to 10 mg in the morning. He will continue his risperidone at bedtime for more his mood stabilizer. No suicidal ideations noted no hallucinations or psychosis which he has had in the past

## 2015-08-24 NOTE — Progress Notes (Signed)
Patient ID: Jesse Mayer, male   DOB: [birth year], 45 y.o.   MRN: [id number]   Subjective:    Patient ID: Jesse Mayer, male    DOB: 1970/07/17, 45 y.o.   MRN: [id number]  Patient presents for Anxiety Patient here to discuss restarting his medications. For the past few months he's had increased depression and anxiety. He was last seen in July of last year at that time and he restarted his medications once again but he stopped taking them after about a month. He states that he now knows that he needs his medications be sustained on the regular. His wife is also discussed this with him. He is now back in the church in renewing his face and understands that he needs to get himself on track. He will to be alive for his family. The only medicine he has been taking his wrist but all there was one last renewal on this he's been taking it for the past 2 weeks. He would like to try something different from his sleep instead of the hydroxyzine he has tried trazodone and Ambien and over-the-counter some the past. Hydroxyzine makes him sleepy the next day he is now working first shift. He has been changing his diet and trying to be more active his weight is down 25 pounds No hallunciations  He is not on BP or statin drug   Review Of Systems:  GEN- denies fatigue, fever, weight loss,weakness, recent illness HEENT- denies eye drainage, change in vision, nasal discharge, CVS- denies chest pain, palpitations RESP- denies SOB, cough, wheeze ABD- denies N/V, change in stools, abd pain GU- denies dysuria, hematuria, dribbling, incontinence MSK- denies joint pain, muscle aches, injury Neuro- denies headache, dizziness, syncope, seizure activity       Objective:    BP 134/74 mmHg  Pulse 72  Temp(Src) 98.5 F (36.9 C) (Oral)  Resp 14  Ht 6\' 4"  (1.93 m)  Wt 375 lb (170.099 kg)  BMI 45.67 kg/m2 GEN- NAD, alert and oriented x3 HEENT- PERRL, EOMI, non injected sclera, pink conjunctiva, MMM,  oropharynx clear CVS- RRR, no murmur RESP-CTAB Psych- normal affect and mood, no SI  EXT- No edema Pulses- Radial  2+        Assessment & Plan:      Problem List Items Addressed This Visit    Morbid obesity ([place])   Major depressive disorder, recurrent episode, severe, with psychosis ([place])   Relevant Medications   citalopram (CELEXA) 10 MG tablet   HTN (hypertension)    Will monitor blood pressure off medications for now he will have fasting labs at her follow-up visit he was congratulated on his weight loss.      Generalized anxiety disorder - Primary    Restart Celexa to 10 mg in the morning. He will continue his risperidone at bedtime for more his mood stabilizer. No suicidal ideations noted no hallucinations or psychosis which he has had in the past      Chronic insomnia    Trial of temazepam will start with 15 mg titrated up to 30 mg         Note: This dictation was prepared with Dragon dictation along with smaller phrase technology. Any transcriptional errors that result from this process are unintentional.

## 2015-08-24 NOTE — Assessment & Plan Note (Signed)
Trial of temazepam will start with 15 mg titrated up to 30 mg

## 2015-08-30 ENCOUNTER — Telehealth: Payer: Self-pay

## 2015-08-30 NOTE — Telephone Encounter (Signed)
Prior authorization done via cover my meds.  Waiting response.

## 2015-09-01 ENCOUNTER — Telehealth: Payer: Self-pay | Admitting: *Deleted

## 2015-09-01 NOTE — Telephone Encounter (Signed)
Received PA determination.   PA is not required.   Faxed to pharmacy.

## 2015-09-26 ENCOUNTER — Other Ambulatory Visit: Payer: Self-pay | Admitting: *Deleted

## 2015-09-26 MED ORDER — CITALOPRAM HYDROBROMIDE 10 MG PO TABS: 10.0000 mg | ORAL_TABLET | Freq: Every day | ORAL | Status: DC

## 2015-09-26 MED ORDER — TEMAZEPAM 15 MG PO CAPS: ORAL_CAPSULE | ORAL | Status: DC

## 2015-09-26 NOTE — Telephone Encounter (Signed)
Received fax requesting refill on Temazepam to mail order.   MD made aware and approved.   Prescription faxed.

## 2015-09-29 ENCOUNTER — Telehealth: Payer: Self-pay | Admitting: *Deleted

## 2015-09-29 ENCOUNTER — Other Ambulatory Visit: Payer: Self-pay | Admitting: *Deleted

## 2015-09-29 MED ORDER — RISPERIDONE 2 MG PO TABS: 2.0000 mg | ORAL_TABLET | Freq: Every day | ORAL | Status: DC

## 2015-09-29 NOTE — Telephone Encounter (Signed)
Received fax requesting refill on Risperdone.   Refill appropriate and filled per protocol.

## 2015-09-29 NOTE — Telephone Encounter (Signed)
Received request from pharmacy for PA on Temazepam.   PA submitted.   Dx: G47.0- insomnia.

## 2015-10-04 NOTE — Telephone Encounter (Signed)
Received PA determination.   PA approved7/05/2015- 09/25/2016.  Ref #: [id number].  Pharmacy made aware.

## 2015-10-05 ENCOUNTER — Ambulatory Visit (INDEPENDENT_AMBULATORY_CARE_PROVIDER_SITE_OTHER): Payer: 59 | Admitting: Family Medicine

## 2015-10-05 ENCOUNTER — Encounter: Payer: Self-pay | Admitting: Family Medicine

## 2015-10-05 VITALS — BP 138/78 | HR 82 | Temp 98.8°F | Resp 14 | Ht 76.0 in | Wt 396.0 lb

## 2015-10-05 DIAGNOSIS — H109 Unspecified conjunctivitis: Secondary | ICD-10-CM | POA: Diagnosis not present

## 2015-10-05 DIAGNOSIS — R7303 Prediabetes: Secondary | ICD-10-CM | POA: Diagnosis not present

## 2015-10-05 DIAGNOSIS — F333 Major depressive disorder, recurrent, severe with psychotic symptoms: Secondary | ICD-10-CM | POA: Diagnosis not present

## 2015-10-05 DIAGNOSIS — E785 Hyperlipidemia, unspecified: Secondary | ICD-10-CM

## 2015-10-05 DIAGNOSIS — F411 Generalized anxiety disorder: Secondary | ICD-10-CM | POA: Diagnosis not present

## 2015-10-05 DIAGNOSIS — G4700 Insomnia, unspecified: Secondary | ICD-10-CM | POA: Diagnosis not present

## 2015-10-05 DIAGNOSIS — F5104 Psychophysiologic insomnia: Secondary | ICD-10-CM

## 2015-10-05 DIAGNOSIS — Z79899 Other long term (current) drug therapy: Secondary | ICD-10-CM | POA: Diagnosis not present

## 2015-10-05 LAB — COMPREHENSIVE METABOLIC PANEL
ALK PHOS: 49 U/L (ref 40–115)
ALT: 25 U/L (ref 9–46)
AST: 23 U/L (ref 10–40)
Albumin: 4.1 g/dL (ref 3.6–5.1)
BILIRUBIN TOTAL: 0.6 mg/dL (ref 0.2–1.2)
BUN: 13 mg/dL (ref 7–25)
CO2: 27 mmol/L (ref 20–31)
CREATININE: 0.88 mg/dL (ref 0.60–1.35)
Calcium: 9.2 mg/dL (ref 8.6–10.3)
Chloride: 99 mmol/L (ref 98–110)
GLUCOSE: 98 mg/dL (ref 70–99)
POTASSIUM: 4.6 mmol/L (ref 3.5–5.3)
SODIUM: 136 mmol/L (ref 135–146)
Total Protein: 7.1 g/dL (ref 6.1–8.1)

## 2015-10-05 LAB — CBC WITH DIFFERENTIAL/PLATELET
BASOS PCT: 0 %
Basophils Absolute: 0 cells/uL (ref 0–200)
Eosinophils Absolute: 276 cells/uL (ref 15–500)
Eosinophils Relative: 4 %
HCT: 48.3 % (ref 38.5–50.0)
Hemoglobin: 15.9 g/dL (ref 13.0–17.0)
LYMPHS PCT: 38 %
Lymphs Abs: 2622 cells/uL (ref 850–3900)
MCH: 27.4 pg (ref 27.0–33.0)
MCHC: 32.9 g/dL (ref 32.0–36.0)
MCV: 83.1 fL (ref 80.0–100.0)
MONOS PCT: 8 %
MPV: 9.5 fL (ref 7.5–12.5)
Monocytes Absolute: 552 cells/uL (ref 200–950)
NEUTROS ABS: 3450 {cells}/uL (ref 1500–7800)
Neutrophils Relative %: 50 %
PLATELETS: 385 10*3/uL (ref 140–400)
RBC: 5.81 MIL/uL — AB (ref 4.20–5.80)
RDW: 15.1 % — AB (ref 11.0–15.0)
WBC: 6.9 10*3/uL (ref 3.8–10.8)

## 2015-10-05 LAB — LIPID PANEL
CHOL/HDL RATIO: 4.4 ratio (ref <=5.0)
CHOLESTEROL: 201 mg/dL — AB (ref 125–200)
HDL: 46 mg/dL (ref >=40)
LDL Cholesterol: 125 mg/dL (ref <130)
Triglycerides: 148 mg/dL (ref <150)
VLDL: 30 mg/dL (ref <30)

## 2015-10-05 MED ORDER — ERYTHROMYCIN 5 MG/GM OP OINT: 1.0000 "application " | TOPICAL_OINTMENT | Freq: Four times a day (QID) | OPHTHALMIC | Status: DC

## 2015-10-05 MED ORDER — TEMAZEPAM 15 MG PO CAPS: ORAL_CAPSULE | ORAL | Status: DC

## 2015-10-05 NOTE — Patient Instructions (Addendum)
F/U 3 months  We will call with lab results  Use topical erythromycin Use warm compresses  Use allergy drop over the counter

## 2015-10-05 NOTE — Progress Notes (Signed)
Patient ID: Jesse Mayer, male   DOB: 20-Mar-1971, 45 y.o.   MRN: [id number]   Subjective:    Patient ID: Jesse Mayer, male    DOB: [birth year], 45 y.o.   MRN: [id number]  Patient presents for 6 week F/U  Patient for interim follow-up on medications. At her last visit to restart his medications for his major depressive disorder along with his anxiety and insomnia. Celexa was restarted at 10 mg and risperidone was continued at bedtime as his mood stabilizer. He is also on a trial of temazepam for insomnia.He feels like he is doing well back on his medications. His wife has also noticed a difference in his mood. He does like to temazepam 15 mg is working well for him now that he is a few weeks into it. He did try 30 mg but had significant daytime somnolence the next day. He is overdue for fasting labs his morbid obesity and setting of hypertension he is to have labs today  He was seen at the urgent care secondary to conjunctivitis in his right eye he now notices a pustule both lower lid still has some swelling of his upper lid and some irritation in both eyes. They did improve with the first round of ciprofloxacin drops but seems like his symptoms have returned. He's not sure if he also has some eye allergies as his wife is also had some itchy eyes recently. He has noted drainage with matting mostly in the right eye  Review Of Systems:  GEN- denies fatigue, fever, weight loss,weakness, recent illness HEENT- denies eye drainage, change in vision, nasal discharge, CVS- denies chest pain, palpitations RESP- denies SOB, cough, wheeze ABD- denies N/V, change in stools, abd pain GU- denies dysuria, hematuria, dribbling, incontinence MSK- denies joint pain, muscle aches, injury Neuro- denies headache, dizziness, syncope, seizure activity       Objective:    BP 138/78 mmHg  Pulse 82  Temp(Src) 98.8 F (37.1 C) (Oral)  Resp 14  Ht 6\' 4"  (1.93 m)  Wt 396 lb (179.624 kg)  BMI 48.22  kg/m2 GEN- NAD, alert and oriented x3 HEENT- PERRL, EOMI, non injected sclera, injected  Conjunctiva R >L, mild erythema and swelling of upper lip, small pustule on edge of lower lid , MMM, oropharynx clear Neck- Supple, no LAD  CVS- RRR, no murmur RESP-CTAB Psych- Normal affect and mood         Assessment & Plan:      Problem List Items Addressed This Visit    Mild hyperlipidemia   Relevant Orders   Lipid panel (Completed)   Major depressive disorder, recurrent episode, severe, with psychosis ([place])    Symptoms improved, continue celexa 10mg , risperdal at bedtime restoril for insomnia Check labs, he is morbidly obese and borderline diabetic  Weight loss goal set for next visit 25lbs       Generalized anxiety disorder   Chronic insomnia   Borderline diabetes - Primary   Relevant Orders   CBC with Differential/Platelet (Completed)   Comprehensive metabolic panel (Completed)   Hemoglobin A1c (Completed)    Other Visit Diagnoses    Long-term use of high-risk medication        Relevant Orders    TSH (Completed)    Conjunctivitis of right eye        recurrent conjunctivitis, mild blepheraitis, given erythromycin ointment, continue compresses, Eye doctor if it does not resolve        Note: This dictation was prepared with Viviann Spare  dictation along with smaller phrase technology. Any transcriptional errors that result from this process are unintentional.

## 2015-10-06 LAB — HEMOGLOBIN A1C
Hgb A1c MFr Bld: 6 % — ABNORMAL HIGH (ref <5.7)
Mean Plasma Glucose: 126 mg/dL

## 2015-10-06 LAB — TSH: TSH: 0.75 mIU/L (ref 0.40–4.50)

## 2015-10-06 NOTE — Assessment & Plan Note (Signed)
Symptoms improved, continue celexa 10mg , risperdal at bedtime restoril for insomnia Check labs, he is morbidly obese and borderline diabetic  Weight loss goal set for next visit 25lbs

## 2016-01-10 ENCOUNTER — Ambulatory Visit: Payer: 59 | Admitting: Family Medicine

## 2016-01-17 ENCOUNTER — Ambulatory Visit: Payer: 59 | Admitting: Family Medicine

## 2016-04-04 ENCOUNTER — Encounter: Payer: Self-pay | Admitting: Family Medicine

## 2016-04-04 ENCOUNTER — Ambulatory Visit (INDEPENDENT_AMBULATORY_CARE_PROVIDER_SITE_OTHER): Payer: 59 | Admitting: Family Medicine

## 2016-04-04 VITALS — BP 130/78 | HR 90 | Temp 98.6°F | Resp 14 | Ht 76.0 in | Wt >= 6400 oz

## 2016-04-04 DIAGNOSIS — R7303 Prediabetes: Secondary | ICD-10-CM | POA: Diagnosis not present

## 2016-04-04 DIAGNOSIS — I10 Essential (primary) hypertension: Secondary | ICD-10-CM | POA: Diagnosis not present

## 2016-04-04 DIAGNOSIS — R0602 Shortness of breath: Secondary | ICD-10-CM

## 2016-04-04 LAB — HEMOGLOBIN A1C, FINGERSTICK: Hgb A1C (fingerstick): 5.9 % — ABNORMAL HIGH (ref <5.7)

## 2016-04-04 MED ORDER — LISINOPRIL 10 MG PO TABS: 10.0000 mg | ORAL_TABLET | Freq: Every day | ORAL | 3 refills | Status: DC

## 2016-04-04 NOTE — Progress Notes (Signed)
Subjective:    Patient ID: Jesse Mayer, male    DOB: 12-30-1970, 46 y.o.   MRN: [id number]  Patient presents for HTN (patient has been taking BP at drug stores and noted that he is having high readings- states that he is having issues with intermittent blurry vision, and feeling winded at work) Issue here to discuss his blood pressure. He has history of hypertension the past she was on lisinopril and hydrochlorothiazide he was stopped off her HCTZ because of the urinary frequency that his blood pressure did start to improve. The lisinopril. He states his blood pressures been fluctuating up he's been short of breath with exertion. He has not had any true chest pain. He has gained was 30 pounds since her last visit 6 months ago. At that time he was artery borderline diabetic. 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 He also continues to smoke. Asked about his significant weight gain and states that he just does not have any self-control he enjoys eating out he's been drinking soda and Kool-Aid and he is not doing any type of exercise. Now he can hardly function daily with his weight. He has been on diabetic medications in the past his insurance unfortunately does not cover weight loss surgery he is Re: Look into this. He is willing to go see a nutritionist  He feels like his mood has been good he does enjoy his job his family is doing well otherwise. He is taking his medication as prescribed  Review Of Systems:  GEN- denies fatigue, fever, weight loss,weakness, recent illness HEENT- denies eye drainage, change in vision, nasal discharge, CVS- denies chest pain, palpitations RESP- +SOB, cough, wheeze ABD- denies N/V, change in stools, abd pain GU- denies dysuria, hematuria, dribbling, incontinence MSK- denies joint pain, muscle aches, injury Neuro- denies headache, dizziness, syncope, seizure  activity       Objective:    BP 130/78 (BP Location: Left Arm, Patient Position: Sitting, Cuff Size: Large) Comment (Cuff Size): thigh cuff  Pulse 90   Temp 98.6 F (37 C) (Oral)   Resp 14   Ht 6\' 4"  (1.93 m)   Wt (!) 424 lb (192.3 kg)   SpO2 97%   BMI 51.61 kg/m  GEN- NAD, alert and oriented x3,obese  HEENT- PERRL, EOMI, non injected sclera, pink conjunctiva, MMM, oropharynx clear Neck- Supple, no thyromegaly CVS- RRR, no murmur RESP-CTAB ABD-NABS,soft,NT,ND EXT- No edema Pulses- Radial, DP- 2+  EKG-NSR, incomplete RBB (unchanged)  2 laps around clinic ( approx 1-2 blocks) sat 96% HR jumped to  119 winded       Assessment & Plan:      Problem List Items Addressed This Visit    Morbid obesity ([place])   Relevant Orders   DG Chest 2 View   Amb ref to Medical Nutrition Therapy-MNT   HTN (hypertension)    I believe that his symptoms are mostly due to his significant morbid obesity he has a BMI over 50. He is now unable to do any strenuous activity without significant shortness of breath. He is also a smoker which discussed this along with his habitus pressing around his lungs is making him more short of breath. I am then obtain a chest x-ray. His EKG today was unchanged from his previous. He denies any actual chest pain. As it is difficult to assess fluid status that he does not  have any extremity edema I am then obtain a BNP with his labs. Check his A1c today he has not diabetic. I will also check his renal function and liver function. With regards to the blood pressure will restart lisinopril at 10 mg once a day. We'll also get him set up to see a nutritionist and work on his weight loss and eating habits. I think we may need to have some psychotherapy associated with this as well will see how he does with OTHER workup first.      Relevant Medications   lisinopril (PRINIVIL,ZESTRIL) 10 MG tablet   Other Relevant Orders   Amb ref to Medical Nutrition Therapy-MNT    Borderline diabetes   Relevant Orders   Hemoglobin A1C, fingerstick (Completed)   Amb ref to Medical Nutrition Therapy-MNT    Other Visit Diagnoses    Shortness of breath on exertion    -  Primary   Relevant Orders   CBC with Differential/Platelet (Completed)   Comprehensive metabolic panel (Completed)   Brain natriuretic peptide (Completed)   EKG 12-Lead (Completed)   DG Chest 2 View      Note: This dictation was prepared with Dragon dictation along with smaller phrase technology. Any transcriptional errors that result from this process are unintentional.

## 2016-04-04 NOTE — Patient Instructions (Addendum)
Start lisinopril No fast food, no SODA, no ICEE  [facility]  Nutrition referral  YOu have to lose weight!  F/U 4 weeks

## 2016-04-05 ENCOUNTER — Encounter: Payer: Self-pay | Admitting: Family Medicine

## 2016-04-05 LAB — CBC WITH DIFFERENTIAL/PLATELET
Basophils Absolute: 0 cells/uL (ref 0–200)
Basophils Relative: 0 %
EOS ABS: 288 {cells}/uL (ref 15–500)
Eosinophils Relative: 4 %
HCT: 49.4 % (ref 38.5–50.0)
Hemoglobin: 16.1 g/dL (ref 13.0–17.0)
LYMPHS ABS: 2880 {cells}/uL (ref 850–3900)
LYMPHS PCT: 40 %
MCH: 27.4 pg (ref 27.0–33.0)
MCHC: 32.6 g/dL (ref 32.0–36.0)
MCV: 84.2 fL (ref 80.0–100.0)
MPV: 9.6 fL (ref 7.5–12.5)
Monocytes Absolute: 576 cells/uL (ref 200–950)
Monocytes Relative: 8 %
Neutro Abs: 3456 cells/uL (ref 1500–7800)
Neutrophils Relative %: 48 %
PLATELETS: 424 10*3/uL — AB (ref 140–400)
RBC: 5.87 MIL/uL — ABNORMAL HIGH (ref 4.20–5.80)
RDW: 14.4 % (ref 11.0–15.0)
WBC: 7.2 10*3/uL (ref 3.8–10.8)

## 2016-04-05 LAB — COMPREHENSIVE METABOLIC PANEL
ALT: 26 U/L (ref 9–46)
AST: 25 U/L (ref 10–40)
Albumin: 4.2 g/dL (ref 3.6–5.1)
Alkaline Phosphatase: 54 U/L (ref 40–115)
BUN: 15 mg/dL (ref 7–25)
CHLORIDE: 101 mmol/L (ref 98–110)
CO2: 26 mmol/L (ref 20–31)
Calcium: 9.3 mg/dL (ref 8.6–10.3)
Creat: 1.1 mg/dL (ref 0.60–1.35)
Glucose, Bld: 99 mg/dL (ref 70–99)
Potassium: 4.8 mmol/L (ref 3.5–5.3)
SODIUM: 138 mmol/L (ref 135–146)
Total Bilirubin: 0.5 mg/dL (ref 0.2–1.2)
Total Protein: 7.5 g/dL (ref 6.1–8.1)

## 2016-04-05 LAB — BRAIN NATRIURETIC PEPTIDE: Brain Natriuretic Peptide: <4 pg/mL (ref <100)

## 2016-04-05 NOTE — Assessment & Plan Note (Signed)
I believe that his symptoms are mostly due to his significant morbid obesity he has a BMI over 50. He is now unable to do any strenuous activity without significant shortness of breath. He is also a smoker which discussed this along with his habitus pressing around his lungs is making him more short of breath. I am then obtain a chest x-ray. His EKG today was unchanged from his previous. He denies any actual chest pain. As it is difficult to assess fluid status that he does not have any extremity edema I am then obtain a BNP with his labs. Check his A1c today he has not diabetic. I will also check his renal function and liver function. With regards to the blood pressure will restart lisinopril at 10 mg once a day. We'll also get him set up to see a nutritionist and work on his weight loss and eating habits. I think we may need to have some psychotherapy associated with this as well will see how he does with OTHER workup first.

## 2016-05-02 ENCOUNTER — Ambulatory Visit: Payer: 59 | Admitting: Family Medicine

## 2016-05-10 ENCOUNTER — Ambulatory Visit: Payer: Managed Care, Other (non HMO) | Admitting: Registered"

## 2016-05-24 ENCOUNTER — Telehealth: Payer: Self-pay | Admitting: Family Medicine

## 2016-05-24 NOTE — Telephone Encounter (Signed)
Finally able to reach pt.  Pt was unaware that he had missed appt here with [place].  Explained about the referrals for Nutrition and Chest X-ray.  He is agreeable for me to send him request for CXR and he will have done near home.  Has spoken to Nutritionist from here and is agreeable to see when his schedule settles down and has number to call them.

## 2016-05-24 NOTE — Telephone Encounter (Signed)
-----   Message from Alycia Rossetti, MD sent at 04/23/2016  1:04 PM EST ----- Regarding: RE: See CXR and Referral  Then set up in [place] advise him that you cant get through , but he needs appt ----- Message ----- From: Olena Mater, LPN Sent: [id number]  12:44 PM To: Alycia Rossetti, MD Subject: RE: See CXR and Referral                       In reference to Nutrition referral.  Have not found anything like that in [place].  Have seen programs at other [facility] sites but then again.  NO ONE calls me back. ----- Message ----- From: Alycia Rossetti, MD Sent: 04/05/2016  10:14 AM To: Olena Mater, LPN Subject: See CXR and Referral                            He needs XRAY set up in [facility] with one of the [facility] facilities.

## 2016-05-24 NOTE — Telephone Encounter (Signed)
-----   Message from Alycia Rossetti, MD sent at 04/23/2016  1:03 PM EST ----- Regarding: RE: See CXR and Referral  Unfortunately he does not live in [place]. You can see if he is willing to come pick one up, or mail one to him  ----- Message ----- From: Olena Mater, LPN Sent: [id number]  12:42 PM To: Alycia Rossetti, MD Subject: RE: See CXR and Referral                       I have called [facility] facilities and not getting any calls back.  If you give him a prescription for xray he can probably just walk in and have it done. ----- Message ----- From: Alycia Rossetti, MD Sent: 04/05/2016  10:14 AM To: Olena Mater, LPN Subject: See CXR and Referral                            He needs XRAY set up in [facility] with one of the [facility] facilities.

## 2016-06-11 ENCOUNTER — Telehealth: Payer: Self-pay | Admitting: *Deleted

## 2016-06-11 MED ORDER — TEMAZEPAM 15 MG PO CAPS: ORAL_CAPSULE | ORAL | 1 refills | Status: DC

## 2016-06-11 NOTE — Telephone Encounter (Signed)
Prescription faxed

## 2016-06-11 NOTE — Telephone Encounter (Signed)
okay

## 2016-06-11 NOTE — Telephone Encounter (Signed)
Received fax requesting refill on temazepam to mail order.   Ok to refill??  Last office visit 04/04/2016.  Last refill 10/05/2015, #1 refill to mail order.

## 2016-08-09 ENCOUNTER — Encounter: Payer: Self-pay | Admitting: Family Medicine

## 2016-08-09 ENCOUNTER — Ambulatory Visit (INDEPENDENT_AMBULATORY_CARE_PROVIDER_SITE_OTHER): Payer: 59 | Admitting: Family Medicine

## 2016-08-09 ENCOUNTER — Encounter: Payer: Self-pay | Admitting: *Deleted

## 2016-08-09 VITALS — BP 136/88 | HR 100 | Temp 98.2°F | Resp 20 | Ht 76.0 in | Wt 395.0 lb

## 2016-08-09 DIAGNOSIS — M545 Low back pain, unspecified: Secondary | ICD-10-CM

## 2016-08-09 MED ORDER — CYCLOBENZAPRINE HCL 10 MG PO TABS: 10.0000 mg | ORAL_TABLET | Freq: Three times a day (TID) | ORAL | 0 refills | Status: DC | PRN

## 2016-08-09 MED ORDER — PREDNISONE 20 MG PO TABS: ORAL_TABLET | ORAL | 0 refills | Status: DC

## 2016-08-09 NOTE — Progress Notes (Signed)
Subjective:    Patient ID: Jesse Mayer, male    DOB: 02-Apr-1970, 46 y.o.   MRN: [id number]  HPI Since Sunday, the patient has had left-sided low back pain. The pain is located just above his left ilium. It is in the muscles. It is tender to palpation. He denies any injury. He states that he went to visit his mother and was sleeping in his own bed and that may have set it off but he denies any falls or serious trauma. However ever since Sunday, as soon as he puts his foot on the floor, he will feel sharp sudden pain in his lower back. The more he is up moving around, the pain will eventually ease off. However any prolonged period of inactivity causes the pain returned as soon as he starts to walk on it again. He has no pain with internal or external rotation of the left hip. He has no pain with hip flexion. The pain is not located in the hip. It is centered solely in the left side of his lower back. He denies any numbness or tingling radiating down his left leg. He denies any weakness in his left leg. 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 This appears to be more of a area of scar tissue that occasionally drains. It is not erythematous. It is not warm. It is not painful. He is currently on antibiotics from [facility]. He did go [facility] last night and had a CAT scan done that per his report was normal I am not sure if the imaged his lumbar spine however. Past Medical History:  Diagnosis Date  . Anxiety   . Depression   . Hypertension   . Substance abuse    in remission   Past Surgical History:  Procedure Laterality Date  . COLONOSCOPY N/A 01/01/2013   Procedure: COLONOSCOPY;  Surgeon: Rogene Houston, MD;  Location: [facility];  Service: Endoscopy;  Laterality: N/A;  830  . FOOT SURGERY    . htn    . TONSILLECTOMY     childhood   Current Outpatient Prescriptions on File Prior to Visit  Medication Sig Dispense Refill  . citalopram (CELEXA) 10 MG  tablet Take 1 tablet (10 mg total) by mouth daily. 90 tablet 3  . lisinopril (PRINIVIL,ZESTRIL) 10 MG tablet Take 1 tablet (10 mg total) by mouth daily. 30 tablet 3  . risperiDONE (RISPERDAL) 2 MG tablet Take 1 tablet (2 mg total) by mouth at bedtime. 90 tablet 3  . temazepam (RESTORIL) 15 MG capsule Take 1 capsules at bedtime for sleep as needed 90 capsule 1   No current facility-administered medications on file prior to visit.    No Known Allergies Social History   Social History  . Marital status: Married    Spouse name: N/A  . Number of children: N/A  . Years of education: N/A   Occupational History  . Not on file.   Social History Main Topics  . Smoking status: Current Every Day Smoker    Packs/day: 1.00    Years: 20.00    Types: Cigarettes  . Smokeless tobacco: Never Used     Comment: 1 pack every 3rd day  . Alcohol use 0.6 oz/week    1 Cans of beer per week     Comment: occasionally  . Drug use: No  . Sexual activity: Not on file     Comment: no birth control/no protection  Other Topics Concern  . Not on file   Social History Narrative  . No narrative on file      Review of Systems  All other systems reviewed and are negative.      Objective:   Physical Exam  Cardiovascular: Normal rate, regular rhythm and normal heart sounds.   Pulmonary/Chest: Effort normal and breath sounds normal.  Musculoskeletal:       Left hip: Normal. He exhibits normal range of motion, normal strength, no tenderness, no bony tenderness and no crepitus.       Lumbar back: He exhibits decreased range of motion, tenderness, pain and spasm. He exhibits no bony tenderness, no swelling, no edema and no deformity.       Back:       Left upper leg: Normal. He exhibits no tenderness, no bony tenderness and no swelling.  Neurological: He has normal reflexes. He displays normal reflexes. He exhibits normal muscle tone. Coordination normal.  Vitals reviewed.         Assessment &  Plan:  Acute left-sided low back pain without sciatica - Plan: predniSONE (DELTASONE) 20 MG tablet, cyclobenzaprine (FLEXERIL) 10 MG tablet  I suspect this either represents a muscle spasm/muscle strain in his lower back versus a herniated disc in his lower back without sciatica. Patient saw benefit from ibuprofen. Therefore I will start the patient on a prednisone taper pack. He can use Flexeril 10 mg every 8 hours as needed for muscle spasms. I will try to obtain a copy of the CT scan from [place] to see if the imaged his lumbar spine. If not, I would like to obtain a basic x-ray of the lumbar spine. I did caution the patient that sometimes steroids can cause mood symptoms. I see that he has a history of major depression with psychotic features. He states that he has been fine and no longer requires Risperdal. He will watch closely for any mood symptoms on the prednisone.

## 2016-09-02 ENCOUNTER — Emergency Department (HOSPITAL_COMMUNITY)
Admission: EM | Admit: 2016-09-02 | Discharge: 2016-09-04 | Disposition: A | Discharge status: Other Acute Inpatient Hospital | Payer: 59 | Attending: Emergency Medicine | Admitting: Emergency Medicine

## 2016-09-02 ENCOUNTER — Encounter (HOSPITAL_COMMUNITY): Payer: Self-pay | Admitting: Emergency Medicine

## 2016-09-02 DIAGNOSIS — F323 Major depressive disorder, single episode, severe with psychotic features: Secondary | ICD-10-CM | POA: Diagnosis present

## 2016-09-02 DIAGNOSIS — Z046 Encounter for general psychiatric examination, requested by authority: Secondary | ICD-10-CM | POA: Insufficient documentation

## 2016-09-02 DIAGNOSIS — Z79899 Other long term (current) drug therapy: Secondary | ICD-10-CM | POA: Diagnosis not present

## 2016-09-02 DIAGNOSIS — F17210 Nicotine dependence, cigarettes, uncomplicated: Secondary | ICD-10-CM | POA: Insufficient documentation

## 2016-09-02 DIAGNOSIS — I10 Essential (primary) hypertension: Secondary | ICD-10-CM | POA: Diagnosis not present

## 2016-09-02 DIAGNOSIS — F29 Unspecified psychosis not due to a substance or known physiological condition: Secondary | ICD-10-CM | POA: Insufficient documentation

## 2016-09-02 LAB — COMPREHENSIVE METABOLIC PANEL
ALT: 37 U/L (ref 17–63)
AST: 44 U/L — AB (ref 15–41)
Albumin: 4.2 g/dL (ref 3.5–5.0)
Alkaline Phosphatase: 49 U/L (ref 38–126)
Anion gap: 10 (ref 5–15)
BUN: 13 mg/dL (ref 6–20)
CHLORIDE: 106 mmol/L (ref 101–111)
CO2: 21 mmol/L — AB (ref 22–32)
CREATININE: 1.2 mg/dL (ref 0.61–1.24)
Calcium: 9.1 mg/dL (ref 8.9–10.3)
Glucose, Bld: 132 mg/dL — ABNORMAL HIGH (ref 65–99)
POTASSIUM: 3.1 mmol/L — AB (ref 3.5–5.1)
SODIUM: 137 mmol/L (ref 135–145)
Total Bilirubin: 1 mg/dL (ref 0.3–1.2)
Total Protein: 7.3 g/dL (ref 6.5–8.1)

## 2016-09-02 LAB — CBC
HEMATOCRIT: 45.3 % (ref 39.0–52.0)
HEMOGLOBIN: 15.3 g/dL (ref 13.0–17.0)
MCH: 27.7 pg (ref 26.0–34.0)
MCHC: 33.8 g/dL (ref 30.0–36.0)
MCV: 82.1 fL (ref 78.0–100.0)
Platelets: 374 10*3/uL (ref 150–400)
RBC: 5.52 MIL/uL (ref 4.22–5.81)
RDW: 14.1 % (ref 11.5–15.5)
WBC: 5.8 10*3/uL (ref 4.0–10.5)

## 2016-09-02 LAB — CBG MONITORING, ED: GLUCOSE-CAPILLARY: 125 mg/dL — AB (ref 65–99)

## 2016-09-02 LAB — ETHANOL

## 2016-09-02 MED ORDER — STERILE WATER FOR INJECTION IJ SOLN
INTRAMUSCULAR | Status: AC
  Filled 2016-09-02: qty 10

## 2016-09-02 MED ORDER — NICOTINE 21 MG/24HR TD PT24
21.0000 mg | MEDICATED_PATCH | Freq: Every day | TRANSDERMAL | Status: DC
  Administered 2016-09-02 – 2016-09-04 (×3): 21 mg via TRANSDERMAL
  Filled 2016-09-02 (×3): qty 1

## 2016-09-02 MED ORDER — HALOPERIDOL LACTATE 5 MG/ML IJ SOLN
5.0000 mg | Freq: Once | INTRAMUSCULAR | Status: AC
  Administered 2016-09-02: 5 mg via INTRAMUSCULAR
  Filled 2016-09-02: qty 1

## 2016-09-02 MED ORDER — ZIPRASIDONE MESYLATE 20 MG IM SOLR
20.0000 mg | Freq: Once | INTRAMUSCULAR | Status: AC
  Administered 2016-09-02: 20 mg via INTRAMUSCULAR
  Filled 2016-09-02: qty 20

## 2016-09-02 MED ORDER — ONDANSETRON HCL 4 MG PO TABS: 4.0000 mg | ORAL_TABLET | Freq: Three times a day (TID) | ORAL | Status: DC | PRN

## 2016-09-02 MED ORDER — IBUPROFEN 400 MG PO TABS
600.0000 mg | ORAL_TABLET | Freq: Three times a day (TID) | ORAL | Status: DC | PRN
  Administered 2016-09-03: 600 mg via ORAL
  Filled 2016-09-02: qty 1

## 2016-09-02 MED ORDER — LORAZEPAM 2 MG/ML IJ SOLN
2.0000 mg | Freq: Once | INTRAMUSCULAR | Status: AC
  Administered 2016-09-02: 2 mg via INTRAMUSCULAR
  Filled 2016-09-02: qty 1

## 2016-09-02 MED ORDER — ACETAMINOPHEN 325 MG PO TABS
650.0000 mg | ORAL_TABLET | ORAL | Status: DC | PRN
  Administered 2016-09-02: 650 mg via ORAL
  Filled 2016-09-02: qty 2

## 2016-09-02 MED ORDER — ALUM & MAG HYDROXIDE-SIMETH 200-200-20 MG/5ML PO SUSP: 30.0000 mL | Freq: Four times a day (QID) | ORAL | Status: DC | PRN

## 2016-09-02 NOTE — ED Notes (Addendum)
Pt arrived to facility admitting to hearing voices, states it is "God's voice" pt admits to wanting to harm "satan's children" however does not make any specific remarks. Pt walked out of triage shortly after speaking to Dr. Sabra Heck EDP - pt verbalized that he was willing to rid his wife of her demons and would take her to hell if she was ready. Pt walked out of the facility refusing to return, off duty [employer] called additional [employer] officers for assistance, myself and security attempted multiple times to talk with patient and deescalate pt with no success. Dr. Sabra Heck completed IVC paperwork. [employer] officers also made multiple attemtps to persuade patient to return to the ER for evaluation. Ultimately pt had to be handcuffed by [employer], placed on stretcher in parking lot and brought back to the ER. - Trish Fountain [facility]

## 2016-09-02 NOTE — Progress Notes (Signed)
Pt has been faxed to the following inpt facilities for possible admission:  [facility], [facility], [facility], [facility], [facility], [place], [facility], [facility], [facility]  Lind Covert, MSW, [employer] TTS Specialist [phone]

## 2016-09-02 NOTE — ED Notes (Addendum)
Pt removed hard wrist restraints and walked out of POD E doors, security and [employer] made aware. Attempts made to redirect pt. Pt made it to emergency doors in POD C when [employer] grabbed pt and physically redirected him back to room 47

## 2016-09-02 NOTE — ED Provider Notes (Signed)
[place] DEPT Provider Note   CSN: [id number] Arrival date & time: 09/02/16  1404     History   Chief Complaint Chief Complaint  Patient presents with  . Medical Clearance    HPI FRANCES JOYNT is a 46 y.o. male.  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 Over the last 3 months he has stopped taking his medications and he has started to have more of these symptoms culminating today when he went to church by himself with a blank stare on his face, it was reported that he had dark-looking eyes, he started to say that his wife had demons and that the only way to get rid of them was to kill her. She was able to get him to the hospital for evaluation for this reason. He has had the same presentations in the past, he has had to be committed in the past. The patient refuses to give any other information. There is no fevers, no chest pain, he has no other complains.  Past Medical History:  Diagnosis Date  . Anxiety   . Depression   . Hypertension   . Substance abuse    in remission    Patient Active Problem List   Diagnosis Date Noted  . Chronic insomnia 08/24/2015  . Borderline diabetes 10/19/2014  . Preoperative examination 08/03/2013  . Incomplete right bundle branch block 08/03/2013  . Morbid obesity ([place]) 05/10/2013  . Unspecified sleep apnea 11/11/2012  . Routine general medical examination at a health care facility 11/11/2012  . Chronic diarrhea 11/11/2012  . Rectal bleeding 11/11/2012  . Boil of buttock 11/11/2012  . Taking multiple medications for chronic disease 05/06/2012  . HTN (hypertension) 08/14/2011  . Mild hyperlipidemia 08/14/2011  . Tobacco user 08/14/2011  . Generalized anxiety disorder 07/02/2011  . Major depressive disorder, recurrent episode, severe, with  psychosis ([place]) 07/01/2011    Past Surgical History:  Procedure Laterality Date  . COLONOSCOPY N/A 01/01/2013   Procedure: COLONOSCOPY;  Surgeon: Rogene Houston, MD;  [facility];  Service: Endoscopy;  Laterality: N/A;  830  . FOOT SURGERY    . htn    . TONSILLECTOMY     childhood       Home Medications    Prior to Admission medications   Medication Sig Start Date End Date Taking? Authorizing Provider  citalopram (CELEXA) 10 MG tablet Take 1 tablet (10 mg total) by mouth daily. 09/26/15   [place], Modena Nunnery, MD  cyclobenzaprine (FLEXERIL) 10 MG tablet Take 1 tablet (10 mg total) by mouth 3 (three) times daily as needed for muscle spasms. 08/09/16   Susy Frizzle, MD  lisinopril (PRINIVIL,ZESTRIL) 10 MG tablet Take 1 tablet (10 mg total) by mouth daily. 04/04/16   Alycia Rossetti, MD  predniSONE (DELTASONE) 20 MG tablet 3 tabs poqday 1-2, 2 tabs poqday 3-4, 1 tab poqday 5-6 08/09/16   Susy Frizzle, MD  risperiDONE (RISPERDAL) 2 MG tablet Take 1 tablet (2 mg total) by mouth at bedtime. 09/29/15   Alycia Rossetti, MD  temazepam (RESTORIL) 15 MG capsule Take 1 capsules at bedtime for sleep as needed 06/11/16   Alycia Rossetti, MD    Family History Family History  Problem Relation Age of Onset  . Depression Maternal Grandfather   . Dementia Maternal Grandfather  and MGGM, his mother  . OCD Mother   . Alcohol abuse Maternal Uncle   . Drug abuse Maternal Uncle   . Cancer Paternal Grandmother        colon cancer  . Colon cancer Paternal Grandmother   . ADD / ADHD Neg Hx   . Anxiety disorder Neg Hx   . Bipolar disorder Neg Hx   . Paranoid behavior Neg Hx   . Schizophrenia Neg Hx   . Seizures Neg Hx   . Sexual abuse Neg Hx   . Physical abuse Neg Hx     Social History Social History  Substance Use Topics  . Smoking status: Current Every Day Smoker    Packs/day: 1.00    Years: 20.00    Types: Cigarettes  . Smokeless tobacco: Never Used      Comment: 1 pack every 3rd day  . Alcohol use 0.6 oz/week    1 Cans of beer per week     Comment: occasionally     Allergies   Patient has no known allergies.   Review of Systems Review of Systems  All other systems reviewed and are negative.    Physical Exam Updated Vital Signs BP (!) 171/103 (BP Location: Right Arm)   Pulse (!) 114   Temp 98 F (36.7 C) (Oral)   Resp 18   SpO2 96%   Physical Exam  Constitutional: He appears well-developed and well-nourished. No distress.  HENT:  Head: Normocephalic and atraumatic.  Mouth/Throat: Oropharynx is clear and moist. No oropharyngeal exudate.  Eyes: Conjunctivae and EOM are normal. Pupils are equal, round, and reactive to light. Right eye exhibits no discharge. Left eye exhibits no discharge. No scleral icterus.  Neck: Normal range of motion. Neck supple. No JVD present. No thyromegaly present.  Cardiovascular: Normal rate, regular rhythm, normal heart sounds and intact distal pulses.  Exam reveals no gallop and no friction rub.   No murmur heard. Pulmonary/Chest: Effort normal and breath sounds normal. No respiratory distress. He has no wheezes. He has no rales.  Abdominal: Soft. Bowel sounds are normal. He exhibits no distension and no mass. There is no tenderness.  Musculoskeletal: Normal range of motion. He exhibits no edema or tenderness.  Lymphadenopathy:    He has no cervical adenopathy.  Neurological: He is alert. Coordination normal.  Skin: Skin is warm and dry. No rash noted. No erythema.  Psychiatric: He has a normal mood and affect. His behavior is normal.  The patient has a normal gait, normal speech, normal coordination, the patient is actively psychotic, he is paranoid, he is looking around the room to see who is looking, he eventually stands up and refuses to be seen, cursing he walks out of the room. The patient appears to be responding to some internal stimuli  Nursing note and vitals reviewed.    ED  Treatments / Results  Labs (all labs ordered are listed, but only abnormal results are displayed) Labs Reviewed  COMPREHENSIVE METABOLIC PANEL  ETHANOL  CBC  RAPID URINE DRUG SCREEN, HOSP PERFORMED    The pt has acute psychosis - he appears dangerous to himself and others, he is going to require involuntary commitment. I will fill out the papers. I will discuss this with psychiatry, he will most certainly need to be back on antipsychotics and admitted to the psychiatric ward. Again the patient is a danger to himself and others and has made verbal threats against his wife to kill her to release her demons  Radiology No results found.  Procedures Procedures (including critical care time)  Medications Ordered in ED Medications - No data to display   Initial Impression / Assessment and Plan / ED Course  I have reviewed the triage vital signs and the nursing notes.  Pertinent labs & imaging results that were available during my care of the patient were reviewed by me and considered in my medical decision making (see chart for details).   Final Clinical Impressions(s) / ED Diagnoses   Final diagnoses:  Psychosis, unspecified psychosis type    New Prescriptions New Prescriptions   No medications on file     Noemi Chapel, MD 09/02/16 1456

## 2016-09-02 NOTE — ED Notes (Signed)
Pt refused all blood work at this time

## 2016-09-02 NOTE — ED Triage Notes (Signed)
Pt here for eval; pt not taking mental health meds and sts "his wife had demons that he needs to help her get rid of" and wife sts "he told her she was going to hell and wanted to know if she wanted to go now"; pt denies SI/HI; pt admits to [facility]

## 2016-09-02 NOTE — ED Notes (Signed)
Jesse Mayer [phone] (wife)

## 2016-09-02 NOTE — BH Assessment (Signed)
Tele Assessment Note   Jesse Mayer is a 46 y.o. male who presented to [facility] on a voluntary basis and was placed under IVC by EDP.  Per hospital report, Pt arrived at the hospital stating that he was hearing voices ("God's voice"), and that he wanted to harm "Satan's children" and also that he was going to leave so he could drive demons from his wife.  Client tried to leave triage and was placed under IVC.  Also per hospital report, Pt has made similar presentations to ED, and these presentations are usually related to use of hallucinogens.  At time of assessment, UDS and BAL were not available (Pt refused to have blood drawn).  Pt provided history.  Pt reported that he is happier than he has ever felt before.  He acknowledged hearing voices, which he described as "God's voice."  He denied suicidal ideation, homicidal ideation, and self-injurious behavior.  Pt also denied substance use.  As indicated above, UDS was not available at time of assessment.  When asked if Pt wanted to harm anyone, Pt said that he wanted to harm "Satan's children."  Pt denied visual hallucination.  Overall, Pt gave the impression that he was careful when speaking and was guarded.  During assessment, Pt presented as alert and oriented.  He had fair eye contact.  Demeanor was very guarded.  Pt was dressed in scrubs and appeared appropriately groomed.  Pt's mood was euthymic.  Affect was blunted and guarded.  Pt denied suicidal ideation or homicidal ideation.  He endorsed auditory and visual hallucinations -- hearing God's voice, seeing blackness/spiritual darkness in people.  Pt denied substance use, but this could not be confirmed by UDS.  Pt's speech was normal in rate, rhythm, and volume.  Pt's thought processes were within normal range.  Thought content indicated delusion.  Pt's memory and concentration could not be adequately assessed.  Impulse control, judgment, and insight control were deemed poor overall.  Consulted  with Justine Null who recommended inpatient.  Diagnosis: Brief Psychotic Disorder; r/o substance-induced psychosis.  Past Medical History:  Past Medical History:  Diagnosis Date  . Anxiety   . Depression   . Hypertension   . Substance abuse    in remission    Past Surgical History:  Procedure Laterality Date  . COLONOSCOPY N/A 01/01/2013   Procedure: COLONOSCOPY;  Surgeon: Rogene Houston, MD;  [facility];  Service: Endoscopy;  Laterality: N/A;  830  . FOOT SURGERY    . htn    . TONSILLECTOMY     childhood    Family History:  Family History  Problem Relation Age of Onset  . Depression Maternal Grandfather   . Dementia Maternal Grandfather        and MGGM, his mother  . OCD Mother   . Alcohol abuse Maternal Uncle   . Drug abuse Maternal Uncle   . Cancer Paternal Grandmother        colon cancer  . Colon cancer Paternal Grandmother   . ADD / ADHD Neg Hx   . Anxiety disorder Neg Hx   . Bipolar disorder Neg Hx   . Paranoid behavior Neg Hx   . Schizophrenia Neg Hx   . Seizures Neg Hx   . Sexual abuse Neg Hx   . Physical abuse Neg Hx     Social History:  reports that he has been smoking Cigarettes.  He has a 20.00 pack-year smoking history. He has never used smokeless tobacco. He  reports that he drinks about 0.6 oz of alcohol per week . He reports that he uses drugs, including Marijuana.  Additional Social History:  Alcohol / Drug Use Pain Medications: See MAR Prescriptions: See MAR Over the Counter: See MAR History of alcohol / drug use?: Yes (Per Hx, Pt uses hallucinogens)  CIWA: CIWA-Ar BP: (!) 156/97 Pulse Rate: (!) 137 COWS:    PATIENT STRENGTHS: (choose at least two) Curator fund of knowledge  Allergies: No Known Allergies  Home Medications:  (Not in a hospital admission)  OB/GYN Status:  No LMP for male patient.  General Assessment Data Location of Assessment: [facility] ED TTS Assessment: In system Is this a Tele or  Face-to-Face Assessment?: Tele Assessment Is this an Initial Assessment or a Re-assessment for this encounter?: Initial Assessment Marital status: Married Is patient pregnant?: No Pregnancy Status: No Living Arrangements: Spouse/significant other, Children Can pt return to current living arrangement?: Yes Admission Status: Involuntary Is patient capable of signing voluntary admission?: Yes Referral Source: Self/Family/Friend Insurance type: [place] Living Arrangements: Spouse/significant other, Children Name of Psychiatrist: Pt denied Name of Therapist: Pt denied  Education Status Is patient currently in school?: No  Risk to self with the past 6 months Suicidal Ideation: No Has patient been a risk to self within the past 6 months prior to admission? : No (Unknown) Suicidal Intent: No Has patient had any suicidal intent within the past 6 months prior to admission? : Other (comment) (Unknown) Is patient at risk for suicide?: No Suicidal Plan?: No Has patient had any suicidal plan within the past 6 months prior to admission? : Other (comment) (Unknown) What has been your use of drugs/alcohol within the last 12 months?:  (Pt denied; but has hx of hallucinogens) Previous Attempts/Gestures:  (Unknown) Family Suicide History: Unable to assess Recent stressful life event(s):  (Unknown) Persecutory voices/beliefs?: No Depression: No ("I've never felt happier") Substance abuse history and/or treatment for substance abuse?: Yes Suicide prevention information given to non-admitted patients: Not applicable  Risk to Others within the past 6 months Homicidal Ideation: No Does patient have any lifetime risk of violence toward others beyond the six months prior to admission? : No Thoughts of Harm to Others: Yes-Currently Present Comment - Thoughts of Harm to Others: "I want to harm Satan's children" Current Homicidal Intent: No Current Homicidal Plan: No Access to  Homicidal Means: No History of harm to others?: No Assessment of Violence: None Noted Does patient have access to weapons?: No Criminal Charges Pending?: Yes Describe Pending Criminal Charges: DWI; Possession of Marijuana Does patient have a court date: Yes Court Date: 09/21/16 Is patient on probation?: Unknown  Psychosis Hallucinations: Auditory, Visual Delusions: Grandiose (Religious)  Mental Status Report Appearance/Hygiene: In scrubs, Unremarkable Eye Contact: Fair Motor Activity: Freedom of movement, Unremarkable Speech: Logical/coherent Level of Consciousness: Alert Mood: Euthymic Affect:  (Guarded) Anxiety Level: None Thought Processes: Coherent, Relevant Judgement: Impaired Orientation: Person, Place Obsessive Compulsive Thoughts/Behaviors: None  Cognitive Functioning Concentration: Fair Memory: Unable to Assess IQ: Average Insight: Poor Impulse Control: Poor Appetite: Poor Sleep: No Change Vegetative Symptoms: None  ADLScreening [facility] Assessment Services) [name] cognitive ability adequate to safely complete daily activities?: Yes Patient able to express need for assistance with ADLs?: Yes Independently performs ADLs?: Yes (appropriate for developmental age)  Prior Inpatient Therapy Prior Inpatient Therapy:  (Unknown)  Prior Outpatient Therapy Prior Outpatient Therapy:  (Unknown -- UTA)  ADL Screening (condition at time of admission) [name] cognitive  ability adequate to safely complete daily activities?: Yes Is the patient deaf or have difficulty hearing?: No Does the patient have difficulty seeing, even when wearing glasses/contacts?: No Does the patient have difficulty concentrating, remembering, or making decisions?: No Patient able to express need for assistance with ADLs?: Yes Does the patient have difficulty dressing or bathing?: No Independently performs ADLs?: Yes (appropriate for developmental age) Does the patient have difficulty walking  or climbing stairs?: No Weakness of Legs: None Weakness of Arms/Hands: None  Home Assistive Devices/Equipment Home Assistive Devices/Equipment: None  Therapy Consults (therapy consults require a physician order) PT Evaluation Needed: No OT Evalulation Needed: No SLP Evaluation Needed: No Abuse/Neglect Assessment (Assessment to be complete while patient is alone) Physical Abuse: Denies Verbal Abuse: Denies Sexual Abuse: Denies Exploitation of patient/patient's resources: Denies Self-Neglect: Denies Values / Beliefs Cultural Requests During Hospitalization: None Spiritual Requests During Hospitalization: None Consults Spiritual Care Consult Needed: No Social Work Consult Needed: No Regulatory affairs officer (For Healthcare) Does Patient Have a Medical Advance Directive?: No    Additional Information 1:1 In Past 12 Months?: No CIRT Risk: No Elopement Risk: No Does patient have medical clearance?: Yes     Disposition:  Disposition Initial Assessment Completed for this Encounter: Yes Disposition of Patient: Inpatient treatment program Type of inpatient treatment program: Adult (Per Justine Null, Pt meets inpt criteria)  Laurena Slimmer Javionna Leder 09/02/2016 4:44 PM

## 2016-09-02 NOTE — ED Notes (Signed)
Restraints off at this time, pt continues sleeping.

## 2016-09-02 NOTE — ED Provider Notes (Signed)
Patient received myself at shift check out at 1600. Notes reviewed. Patient seen. Continues to be agitated. Attempted to flee again. Had be chemically restrained. Does not be mechanically restrained with soft restraints. Has become more agreeable after chemical sedation. EKG shows appropriate QTC 0.454. Patient was given Haldol 5 and Ativan 2 IM. Awaiting placement for behavioral health.   Tanna Furry, MD 09/02/16 [phone]

## 2016-09-02 NOTE — ED Triage Notes (Signed)
Pt is cooperative at present but seems anxious and unable to have pt change in triage area

## 2016-09-02 NOTE — ED Triage Notes (Signed)
Pt seen by Dr Sabra Heck in triage; pt refused to leave room to go to room in ED; pt then left and is being IVCed at present; [employer] and security aware

## 2016-09-02 NOTE — ED Notes (Signed)
[employer] officers outside of room. Pt states," he is talking to the [place]."

## 2016-09-03 ENCOUNTER — Emergency Department (HOSPITAL_COMMUNITY): Payer: 59

## 2016-09-03 LAB — RAPID URINE DRUG SCREEN, HOSP PERFORMED
Amphetamines: NOT DETECTED
BARBITURATES: NOT DETECTED
BENZODIAZEPINES: POSITIVE — AB
COCAINE: NOT DETECTED
Opiates: NOT DETECTED
TETRAHYDROCANNABINOL: POSITIVE — AB

## 2016-09-03 MED ORDER — RISPERIDONE 1 MG PO TABS: 2.0000 mg | ORAL_TABLET | Freq: Every day | ORAL | Status: DC

## 2016-09-03 MED ORDER — CYCLOBENZAPRINE HCL 10 MG PO TABS
10.0000 mg | ORAL_TABLET | Freq: Three times a day (TID) | ORAL | Status: DC | PRN
  Administered 2016-09-03: 10 mg via ORAL
  Filled 2016-09-03: qty 1

## 2016-09-03 MED ORDER — LISINOPRIL 10 MG PO TABS
10.0000 mg | ORAL_TABLET | Freq: Every day | ORAL | Status: DC
Start: 2016-09-03 — End: 2016-09-04
  Administered 2016-09-03 – 2016-09-04 (×2): 10 mg via ORAL
  Filled 2016-09-03 (×2): qty 1

## 2016-09-03 MED ORDER — CITALOPRAM HYDROBROMIDE 10 MG PO TABS
10.0000 mg | ORAL_TABLET | Freq: Every day | ORAL | Status: DC
  Administered 2016-09-04: 10 mg via ORAL
  Filled 2016-09-03: qty 1

## 2016-09-03 MED ORDER — TEMAZEPAM 15 MG PO CAPS
15.0000 mg | ORAL_CAPSULE | Freq: Every evening | ORAL | Status: DC | PRN
  Administered 2016-09-03: 15 mg via ORAL
  Filled 2016-09-03: qty 1

## 2016-09-03 NOTE — ED Notes (Addendum)
Pt requested his glasses, went and got them from his locker. Sitter at bedside, breakfast tray at bedside. Pt alert and oriented, calm and cooperative at this time.

## 2016-09-03 NOTE — ED Notes (Signed)
Regular Dinner has been ordered for Patient.

## 2016-09-03 NOTE — ED Notes (Signed)
Wife updated about tx plan per pt request.

## 2016-09-03 NOTE — ED Triage Notes (Signed)
Wife returned to bring PT clothing to use during in Pt stay. All clothing in white bag with pt stickers placed in locker .

## 2016-09-03 NOTE — ED Notes (Signed)
Patient had a XX Licensed conveyancer on when he came over to [place] and Patient was given 3X Wine Scrubs, to put on. And Given a Cup of water, and Regular Dinner order was taken.

## 2016-09-03 NOTE — ED Notes (Signed)
Sitter taking pt to shower room.

## 2016-09-03 NOTE — ED Notes (Addendum)
Spoke to pt's wife on the phone to update her on plan of care. Pt aware and verbally consented this. Wife is planning to visit at 1230.

## 2016-09-03 NOTE — Progress Notes (Signed)
Per Legrand Como at [place], pt has been declined due to no appropriate beds.  Lind Covert, MSW, [place] TTS Specialist [phone]

## 2016-09-04 ENCOUNTER — Encounter (HOSPITAL_COMMUNITY): Payer: Self-pay

## 2016-09-04 ENCOUNTER — Inpatient Hospital Stay (HOSPITAL_COMMUNITY)
Admission: AD | Admit: 2016-09-04 | Discharge: 2016-09-07 | DRG: 885 | Disposition: A | Discharge status: Home / Self Care | Payer: 59 | Attending: Psychiatry | Admitting: Psychiatry

## 2016-09-04 DIAGNOSIS — E785 Hyperlipidemia, unspecified: Secondary | ICD-10-CM | POA: Diagnosis present

## 2016-09-04 DIAGNOSIS — F411 Generalized anxiety disorder: Secondary | ICD-10-CM | POA: Diagnosis present

## 2016-09-04 DIAGNOSIS — F1290 Cannabis use, unspecified, uncomplicated: Secondary | ICD-10-CM | POA: Diagnosis not present

## 2016-09-04 DIAGNOSIS — Z79899 Other long term (current) drug therapy: Secondary | ICD-10-CM

## 2016-09-04 DIAGNOSIS — Z818 Family history of other mental and behavioral disorders: Secondary | ICD-10-CM

## 2016-09-04 DIAGNOSIS — F1020 Alcohol dependence, uncomplicated: Secondary | ICD-10-CM | POA: Diagnosis present

## 2016-09-04 DIAGNOSIS — F3164 Bipolar disorder, current episode mixed, severe, with psychotic features: Principal | ICD-10-CM | POA: Diagnosis present

## 2016-09-04 DIAGNOSIS — F29 Unspecified psychosis not due to a substance or known physiological condition: Secondary | ICD-10-CM | POA: Diagnosis present

## 2016-09-04 DIAGNOSIS — F419 Anxiety disorder, unspecified: Secondary | ICD-10-CM | POA: Diagnosis present

## 2016-09-04 DIAGNOSIS — F17213 Nicotine dependence, cigarettes, with withdrawal: Secondary | ICD-10-CM | POA: Diagnosis not present

## 2016-09-04 DIAGNOSIS — Z810 Family history of intellectual disabilities: Secondary | ICD-10-CM | POA: Diagnosis not present

## 2016-09-04 DIAGNOSIS — Z915 Personal history of self-harm: Secondary | ICD-10-CM | POA: Diagnosis not present

## 2016-09-04 DIAGNOSIS — I10 Essential (primary) hypertension: Secondary | ICD-10-CM | POA: Diagnosis present

## 2016-09-04 DIAGNOSIS — G4730 Sleep apnea, unspecified: Secondary | ICD-10-CM | POA: Diagnosis present

## 2016-09-04 DIAGNOSIS — F17210 Nicotine dependence, cigarettes, uncomplicated: Secondary | ICD-10-CM | POA: Diagnosis present

## 2016-09-04 DIAGNOSIS — Z813 Family history of other psychoactive substance abuse and dependence: Secondary | ICD-10-CM | POA: Diagnosis not present

## 2016-09-04 DIAGNOSIS — G4700 Insomnia, unspecified: Secondary | ICD-10-CM | POA: Diagnosis present

## 2016-09-04 DIAGNOSIS — Z811 Family history of alcohol abuse and dependence: Secondary | ICD-10-CM | POA: Diagnosis not present

## 2016-09-04 MED ORDER — NICOTINE 21 MG/24HR TD PT24
21.0000 mg | MEDICATED_PATCH | Freq: Every day | TRANSDERMAL | Status: DC
  Administered 2016-09-04 – 2016-09-07 (×4): 21 mg via TRANSDERMAL
  Filled 2016-09-04 (×7): qty 1

## 2016-09-04 MED ORDER — LISINOPRIL 10 MG PO TABS
10.0000 mg | ORAL_TABLET | Freq: Every day | ORAL | Status: DC
  Filled 2016-09-04 (×2): qty 1

## 2016-09-04 MED ORDER — HYDROXYZINE HCL 25 MG PO TABS: 25.0000 mg | ORAL_TABLET | Freq: Three times a day (TID) | ORAL | Status: DC | PRN

## 2016-09-04 MED ORDER — CITALOPRAM HYDROBROMIDE 10 MG PO TABS
10.0000 mg | ORAL_TABLET | Freq: Every day | ORAL | Status: DC
  Filled 2016-09-04 (×2): qty 1

## 2016-09-04 MED ORDER — MAGNESIUM HYDROXIDE 400 MG/5ML PO SUSP: 30.0000 mL | Freq: Every day | ORAL | Status: DC | PRN

## 2016-09-04 MED ORDER — TRAZODONE HCL 50 MG PO TABS
50.0000 mg | ORAL_TABLET | Freq: Every evening | ORAL | Status: DC | PRN
Start: 2016-09-04 — End: 2016-09-05
  Administered 2016-09-04: 50 mg via ORAL

## 2016-09-04 MED ORDER — ACETAMINOPHEN 325 MG PO TABS: 650.0000 mg | ORAL_TABLET | Freq: Four times a day (QID) | ORAL | Status: DC | PRN

## 2016-09-04 MED ORDER — LISINOPRIL 10 MG PO TABS
10.0000 mg | ORAL_TABLET | Freq: Every day | ORAL | Status: DC
  Administered 2016-09-05 – 2016-09-07 (×3): 10 mg via ORAL
  Filled 2016-09-04: qty 2
  Filled 2016-09-04 (×5): qty 1

## 2016-09-04 MED ORDER — OLANZAPINE 5 MG PO TBDP: 5.0000 mg | ORAL_TABLET | Freq: Two times a day (BID) | ORAL | Status: DC | PRN

## 2016-09-04 MED ORDER — CITALOPRAM HYDROBROMIDE 10 MG PO TABS
10.0000 mg | ORAL_TABLET | Freq: Every day | ORAL | Status: DC
  Administered 2016-09-05 – 2016-09-07 (×3): 10 mg via ORAL
  Filled 2016-09-04 (×6): qty 1

## 2016-09-04 MED ORDER — RISPERIDONE 2 MG PO TABS
2.0000 mg | ORAL_TABLET | Freq: Every day | ORAL | Status: DC
  Administered 2016-09-04 – 2016-09-06 (×3): 2 mg via ORAL
  Filled 2016-09-04 (×7): qty 1

## 2016-09-04 MED ORDER — ALUM & MAG HYDROXIDE-SIMETH 200-200-20 MG/5ML PO SUSP: 30.0000 mL | ORAL | Status: DC | PRN

## 2016-09-04 NOTE — ED Provider Notes (Signed)
Nursing discussed patient with me, he has been complaining of left elbow pain after requiring restraints for aggressive and destructive behavior.  Plain film with ? Acute fx of medial epicondyle of the humerus.  On my exam non tender to that area, no edema full ROM.  Suspect this is an old injury, offered sling which the patient declined.  Suggested NSAIDs and PCP follow up in about a week.    Deno Etienne, DO 09/04/16 [phone]

## 2016-09-04 NOTE — ED Notes (Signed)
[employer] called for pt transport to [facility], accepted to room 502-1. Report called to [place], RN [phone]).

## 2016-09-04 NOTE — ED Notes (Signed)
Breakfast tray ordered 

## 2016-09-04 NOTE — BHH Suicide Risk Assessment (Addendum)
[place] INPATIENT:  Family/Significant Other Suicide Prevention Education  Suicide Prevention Education:  Education Completed; No one has been identified by the patient as the family member/significant other with whom the patient will be residing, and identified as the person(s) who will aid the patient in the event of a mental health crisis (suicidal ideations/suicide attempt).  With written consent from the patient, the family member/significant other has been provided the following suicide prevention education, prior to the and/or following the discharge of the patient.  The suicide prevention education provided includes the following:  Suicide risk factors  Suicide prevention and interventions  National Suicide Hotline telephone number  [facility] assessment telephone number  [facility] Emergency Assistance [place] and/or Residential Mobile Crisis Unit telephone number  Request made of family/significant other to:  Remove weapons (e.g., guns, rifles, knives), all items previously/currently identified as safety concern.    Remove drugs/medications (over-the-counter, prescriptions, illicit drugs), all items previously/currently identified as a safety concern.  The family member/significant other verbalizes understanding of the suicide prevention education information provided.  The family member/significant other agrees to remove the items of safety concern listed above. The patient did not endorse SI at the time of admission, nor did the patient c/o SI during the stay here.  SPE not required.  However, I did talk to wife [place], [phone], and we went over treatment team recommendations and crises plan.  She also requested that we give him an injection if he is willing to take it.  States this is not the first time he has quit taking meds, and each time has become progressively worse in terms of threats and anger.   Trish Mage 09/04/2016, 2:14  PM

## 2016-09-04 NOTE — ED Notes (Signed)
Pt noted to be up approx 10 times during the night for frequent bathroom trips.

## 2016-09-04 NOTE — ED Notes (Signed)
[employer] present to take patient to [facility]

## 2016-09-04 NOTE — ED Provider Notes (Signed)
  Physical Exam  BP (!) 148/86 (BP Location: Right Arm)   Pulse 78   Temp 98 F (36.7 C) (Oral)   Resp 16   Ht 6\' 5"  (1.956 m)   Wt (!) 179.2 kg (395 lb)   SpO2 98%   BMI 46.84 kg/m   Physical Exam  ED Course  Procedures  MDM Patient accepted at [facility]. Stable for transfer        Drenda Freeze, MD 09/04/16 [phone]

## 2016-09-04 NOTE — Tx Team (Signed)
Initial Treatment Plan 09/04/2016 7:07 PM Jesse Mayer [id number]    PATIENT STRESSORS: Medication change or noncompliance   PATIENT STRENGTHS: Ability for insight Communication skills General fund of knowledge Motivation for treatment/growth Physical Health   PATIENT IDENTIFIED PROBLEMS: "I believed I was the lion of [place]"  Patient stated that he was medication non compliant                    DISCHARGE CRITERIA:  Improved stabilization in mood, thinking, and/or behavior  PRELIMINARY DISCHARGE PLAN: Return to previous living arrangement  PATIENT/FAMILY INVOLVEMENT: This treatment plan has been presented to and reviewed with the patient, Jesse Mayer, and/or family member,The patient and family have been given the opportunity to ask questions and make suggestions.  Clarita Crane, RN 09/04/2016, 7:07 PM

## 2016-09-04 NOTE — Progress Notes (Signed)
CSW notified Vicente Males, RN of the acceptance to [facility].   Lind Covert, MSW, [place] Disposition [phone]

## 2016-09-04 NOTE — Progress Notes (Signed)
Pt accepted to [facility] 502-1 by Hughie Closs, NP. Attending will be Dr. Shea Evans, MD. Call to report [phone]. Able to be transported at [place], MSW, [place] Disposition [phone]

## 2016-09-04 NOTE — Progress Notes (Signed)
Patient ID: Jesse Mayer, male   DOB: 09/30/1970, 46 y.o.   MRN: [id number]   Patient admitted to the per IVC petition after an psychiatric crisis in the community.  Patient reported feeling elevated, hyper religious and engaged in bizarre behaviors in the community.  Patient reported feeling scared paranoid and in poor control of himself.  Patient admitted to medication non compliance and stated that once being back on his antipsychotic medications he began to feel better.  Patient presented as calm and cooperative on admission, skin assessment compete no injury or identifiable marks noted.  Patient acknowledged understanding of admission process.

## 2016-09-05 ENCOUNTER — Encounter (HOSPITAL_COMMUNITY): Payer: Self-pay | Admitting: Psychiatry

## 2016-09-05 DIAGNOSIS — Z810 Family history of intellectual disabilities: Secondary | ICD-10-CM

## 2016-09-05 DIAGNOSIS — G4700 Insomnia, unspecified: Secondary | ICD-10-CM

## 2016-09-05 DIAGNOSIS — F1020 Alcohol dependence, uncomplicated: Secondary | ICD-10-CM | POA: Diagnosis present

## 2016-09-05 DIAGNOSIS — Z813 Family history of other psychoactive substance abuse and dependence: Secondary | ICD-10-CM

## 2016-09-05 DIAGNOSIS — Z818 Family history of other mental and behavioral disorders: Secondary | ICD-10-CM

## 2016-09-05 DIAGNOSIS — Z811 Family history of alcohol abuse and dependence: Secondary | ICD-10-CM

## 2016-09-05 DIAGNOSIS — F3164 Bipolar disorder, current episode mixed, severe, with psychotic features: Secondary | ICD-10-CM | POA: Clinically undetermined

## 2016-09-05 MED ORDER — ZOLPIDEM TARTRATE 5 MG PO TABS
5.0000 mg | ORAL_TABLET | Freq: Every evening | ORAL | Status: DC | PRN
  Administered 2016-09-05 – 2016-09-06 (×2): 5 mg via ORAL
  Filled 2016-09-05 (×2): qty 1

## 2016-09-05 MED ORDER — LORAZEPAM 1 MG PO TABS: 1.0000 mg | ORAL_TABLET | Freq: Four times a day (QID) | ORAL | Status: DC | PRN

## 2016-09-05 MED ORDER — LAMOTRIGINE 25 MG PO TABS
25.0000 mg | ORAL_TABLET | Freq: Every day | ORAL | Status: DC
  Administered 2016-09-05 – 2016-09-07 (×3): 25 mg via ORAL
  Filled 2016-09-05 (×5): qty 1

## 2016-09-05 MED ORDER — RISPERIDONE 1 MG PO TBDP: 1.0000 mg | ORAL_TABLET | Freq: Three times a day (TID) | ORAL | Status: DC | PRN
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
## 2016-09-05 NOTE — Progress Notes (Signed)
Patient denies SI, Hi and  AVH.  Patient has been actively engaged in his treatment and expresses how this time has shown him the importance of staying on his medications. Patient expressed how he thought he had control over his psychosis to realize that he really didn't.   Assess patient for safety, offer medications as prescribed, engage patient in 1:1 staff talks.  Patient able to contract for safety, Continue to monitor.

## 2016-09-05 NOTE — BHH Group Notes (Signed)
[facility] Group Notes:  (Counselor/Nursing/MHT/Case Management/Adjunct)  09/05/2016 1:15PM  Type of Therapy:  Group Therapy  Participation Level:  Active  Participation Quality:  Appropriate  Affect:  Flat  Cognitive:  Oriented  Insight:  Improving  Engagement in Group:  Limited  Engagement in Therapy:  Limited  Modes of Intervention:  Discussion, Exploration and Socialization  Summary of Progress/Problems: The topic for group was balance in life.  Pt participated in the discussion about when their life was in balance and out of balance and how this feels.  Pt discussed ways to get back in balance and short term goals they can work on to get where they want to be. Stayed the entire time, engaged throughout.  Stated unlike before he was admitted, he feels balanced today. "I know this because I am able to focus on myself, on others and my goals."  Good feedback to other group members.  Cited his "blind faith" as the thing that helps him find balance.   Roque Lias B 09/05/2016 4:09 PM

## 2016-09-05 NOTE — Progress Notes (Signed)
Patient ID: Jesse Mayer, male   DOB: 08/19/1970, 46 y.o.   MRN: [id number]  D: Patient reports he had a good day. Pt reports he stopped taking his medication because he felt well. Pt reports his mother had a cancer scare that made things worse. Pt presented with depressed mood and flat affect. Pt denies SI/HI/AVH. Pt attended evening wrap up group and Interacted appropriately with peers. Pt denies any needs or concerns.   A:  Medications administered as prescribed. Support and encouragement offered as needed.   R: Patient remains safe and compliant with medication.

## 2016-09-05 NOTE — Tx Team (Signed)
Interdisciplinary Treatment and Diagnostic Plan Update  09/05/2016 Time of Session: 11:50 AM  JANMICHAEL GIRAUD MRN: [id number]  Principal Diagnosis: Bipolar disorder, curr episode mixed, severe, with psychotic features ([place])  Secondary Diagnoses: Principal Problem:   Bipolar disorder, curr episode mixed, severe, with psychotic features ([place]) Active Problems:   Generalized anxiety disorder   Alcohol use disorder, severe, dependence ([place])   Current Medications:  Current Facility-Administered Medications  Medication Dose Route Frequency Provider Last Rate Last Dose  . acetaminophen (TYLENOL) tablet 650 mg  650 mg Oral Q6H PRN Okonkwo, Justina A, NP      . alum & mag hydroxide-simeth (MAALOX/MYLANTA) 200-200-20 MG/5ML suspension 30 mL  30 mL Oral Q4H PRN Okonkwo, Justina A, NP      . citalopram (CELEXA) tablet 10 mg  10 mg Oral Daily Lindell Spar I, NP   10 mg at 09/05/16 0748  . hydrOXYzine (ATARAX/VISTARIL) tablet 25 mg  25 mg Oral TID PRN Lu Duffel, Justina A, NP      . lamoTRIgine (LAMICTAL) tablet 25 mg  25 mg Oral Daily Eappen, Saramma, MD      . lisinopril (PRINIVIL,ZESTRIL) tablet 10 mg  10 mg Oral Daily Lindell Spar I, NP   10 mg at 09/05/16 0748  . LORazepam (ATIVAN) tablet 1 mg  1 mg Oral Q6H PRN Eappen, Saramma, MD      . magnesium hydroxide (MILK OF MAGNESIA) suspension 30 mL  30 mL Oral Daily PRN Okonkwo, Justina A, NP      . nicotine (NICODERM CQ - dosed in mg/24 hours) patch 21 mg  21 mg Transdermal Daily Eappen, Ria Clock, MD   21 mg at 09/05/16 0748  . risperiDONE (RISPERDAL M-TABS) disintegrating tablet 1 mg  1 mg Oral TID PRN Ursula Alert, MD      . risperiDONE (RISPERDAL) tablet 2 mg  2 mg Oral QHS Nwoko, Agnes I, NP   2 mg at 09/04/16 2157    PTA Medications: Prescriptions Prior to Admission  Medication Sig Dispense Refill Last Dose  . citalopram (CELEXA) 10 MG tablet Take 1 tablet (10 mg total) by mouth daily. 90 tablet 3 couple months ago  . cyclobenzaprine  (FLEXERIL) 10 MG tablet Take 1 tablet (10 mg total) by mouth 3 (three) times daily as needed for muscle spasms. 30 tablet 0 maybe few days ago  . lisinopril (PRINIVIL,ZESTRIL) 10 MG tablet Take 1 tablet (10 mg total) by mouth daily. (Patient not taking: Reported on 09/02/2016) 30 tablet 3 Not Taking at Unknown time  . risperiDONE (RISPERDAL) 2 MG tablet Take 1 tablet (2 mg total) by mouth at bedtime. 90 tablet 3 couple months ago  . temazepam (RESTORIL) 15 MG capsule Take 1 capsules at bedtime for sleep as needed (Patient taking differently: Take 15 mg by mouth at bedtime as needed for sleep. ) 90 capsule 1 maybe few nights ago    Treatment Modalities: Medication Management, Group therapy, Case management,  1 to 1 session with clinician, Psychoeducation, Recreational therapy.   Physician Treatment Plan for Primary Diagnosis: Bipolar disorder, curr episode mixed, severe, with psychotic features ([place]) Long Term Goal(s): Improvement in symptoms so as ready for discharge  Short Term Goals: Ability to identify changes in lifestyle to reduce recurrence of condition will improve Ability to verbalize feelings will improve Compliance with prescribed medications will improve Ability to identify changes in lifestyle to reduce recurrence of condition will improve Ability to verbalize feelings will improve Compliance with prescribed medications will improve Ability to identify  triggers associated with substance abuse/mental health issues will improve  Medication Management: Evaluate patient's response, side effects, and tolerance of medication regimen.  Therapeutic Interventions: 1 to 1 sessions, Unit Group sessions and Medication administration.  Evaluation of Outcomes: Adequate for Discharge  Physician Treatment Plan for Secondary Diagnosis: Principal Problem:   Bipolar disorder, curr episode mixed, severe, with psychotic features ([place]) Active Problems:   Generalized anxiety disorder   Alcohol use  disorder, severe, dependence ([place])   Long Term Goal(s): Improvement in symptoms so as ready for discharge  Short Term Goals: Ability to identify changes in lifestyle to reduce recurrence of condition will improve Ability to verbalize feelings will improve Compliance with prescribed medications will improve Ability to identify changes in lifestyle to reduce recurrence of condition will improve Ability to verbalize feelings will improve Compliance with prescribed medications will improve Ability to identify triggers associated with substance abuse/mental health issues will improve  Medication Management: Evaluate patient's response, side effects, and tolerance of medication regimen.  Therapeutic Interventions: 1 to 1 sessions, Unit Group sessions and Medication administration.  Evaluation of Outcomes: Adequate for Discharge   RN Treatment Plan for Primary Diagnosis: Bipolar disorder, curr episode mixed, severe, with psychotic features ([place]) Long Term Goal(s): Knowledge of disease and therapeutic regimen to maintain health will improve  Short Term Goals: Ability to identify and develop effective coping behaviors will improve and Compliance with prescribed medications will improve  Medication Management: RN will administer medications as ordered by provider, will assess and evaluate patient's response and provide education to patient for prescribed medication. RN will report any adverse and/or side effects to prescribing provider.  Therapeutic Interventions: 1 on 1 counseling sessions, Psychoeducation, Medication administration, Evaluate responses to treatment, Monitor vital signs and CBGs as ordered, Perform/monitor CIWA, COWS, AIMS and Fall Risk screenings as ordered, Perform wound care treatments as ordered.  Evaluation of Outcomes: Adequate for Discharge   LCSW Treatment Plan for Primary Diagnosis: Bipolar disorder, curr episode mixed, severe, with psychotic features ([place]) Long Term  Goal(s): Safe transition to appropriate next level of care at discharge, Engage patient in therapeutic group addressing interpersonal concerns.  Short Term Goals: Engage patient in aftercare planning with referrals and resources  Therapeutic Interventions: Assess for all discharge needs, 1 to 1 time with Social worker, Explore available resources and support systems, Assess for adequacy in community support network, Educate family and significant other(s) on suicide prevention, Complete Psychosocial Assessment, Interpersonal group therapy.  Evaluation of Outcomes: Met   Progress in Treatment: Attending groups: Yes Participating in groups: Yes Taking medication as prescribed: Yes Toleration medication: Yes, no side effects reported at this time Family/Significant other contact made: Yes Patient understands diagnosis: Yes AEB asking for help with getting back on meds Discussing patient identified problems/goals with staff: Yes Medical problems stabilized or resolved: Yes Denies suicidal/homicidal ideation: Yes Issues/concerns per patient self-inventory: None Other: N/A  New problem(s) identified: None identified at this time.   New Short Term/Long Term Goal(s): None identified at this time.   Discharge Plan or Barriers:   Reason for Continuation of Hospitalization:  Medication stabilization   Estimated Length of Stay: [place] d/c tomorrow, Friday at the latest  Attendees: Patient: 09/05/2016  11:50 AM  Physician: Ursula Alert, MD 09/05/2016  11:50 AM  Nursing: Hoy Register, RN 09/05/2016  11:50 AM  RN Care Manager: Lars Pinks, RN 09/05/2016  11:50 AM  Social Worker: Ripley Fraise 09/05/2016  11:50 AM  Recreational Therapist: Laretta Bolster  09/05/2016  11:50 AM  Other:  Norberto Sorenson 09/05/2016  11:50 AM  Other:  09/05/2016  11:50 AM    Scribe for Treatment Team:  Roque Lias LCSW 09/05/2016 11:50 AM

## 2016-09-05 NOTE — H&P (Addendum)
Psychiatric Admission Assessment Adult  Patient Identification: Jesse Mayer MRN:  [id number] Date of Evaluation:  09/05/2016 Chief Complaint: Patient states " I thought I was the [place]."  Principal Diagnosis: Bipolar disorder, curr episode mixed, severe, with psychotic features [facility]) Diagnosis:   Patient Active Problem List   Diagnosis Date Noted  . Alcohol use disorder, severe, dependence ([place]) [F10.20] 09/05/2016  . Bipolar disorder, curr episode mixed, severe, with psychotic features ([place]) [F31.64] 09/05/2016  . Chronic insomnia [F51.04] 08/24/2015  . Borderline diabetes [R73.03] 10/19/2014  . Preoperative examination [Z01.818] 08/03/2013  . Incomplete right bundle branch block [I45.10] 08/03/2013  . Morbid obesity ([place]) [E66.01] 05/10/2013  . Unspecified sleep apnea [G47.30] 11/11/2012  . Routine general medical examination at a health care facility [Z00.00] 11/11/2012  . Chronic diarrhea [K52.9] 11/11/2012  . Rectal bleeding [K62.5] 11/11/2012  . Boil of buttock [L02.32] 11/11/2012  . Taking multiple medications for chronic disease [R69] 05/06/2012  . HTN (hypertension) [I10] 08/14/2011  . Mild hyperlipidemia [E78.5] 08/14/2011  . Tobacco user [Z72.0] 08/14/2011  . Generalized anxiety disorder [F41.1] 07/02/2011   History of Present Illness: Jesse Mayer is a 60 y old AAM, who is employed , lives with wife and two children at [place] , has a hx of depression, mood sx, psychosis and anxiety issues, presented with worsening psychosis/agitation.   Patient seen and chart reviewed.Discussed patient with treatment team. Pt today seen as calm, reported that he was going through a lot of stressors. Pt reported that his aunt passed away a month ago suddenly , then his mother got diagnosed with a growth in her lymph node and his other aunt has stage 4 cancer. Pt is the only child , and when his mother got diagnosed with a growth in the lymph node he got so anxious and overwhelmed.  He reported that he started drinking a whole lot. He reports he can drink up to 12 beers per day , but that happens sporadically and only when his drinking gets worse. He reported that he started getting all these psychotic episodes and not remembering them. He had some outbursts at church and he could not remember them later. He felt grandiose that he was the "[place]." Pt reported that he felt like he was special and wanted to help every one. Pt reported some mood swings , periods when is energetic , labile and hyperactive as well as having sleep issues. He can go from having 5 hrs of sleep to an hr of sleep some nights . He states he may have been hypomanic or manic but they do not last too long. Pt reports that he takes Restoril for sleep issues , but makes sure he does not mix it with alcohol. However , he is not able to say for sure if he can do that all the time. He also takes celexa for anxiety sx , he is OK and feels better when he is on celexa since it helps with his anxiety issues. Pt reports he has had prior admissions to IP units in the past , mostly for psychotic episodes similar to this one. He is being followed by his PCP who prescribes him these medications. Pt reports he is employed , married and his family is supportive.   Associated Signs/Symptoms: Depression Symptoms:  depressed mood, insomnia, psychomotor agitation, psychomotor retardation, fatigue, feelings of worthlessness/guilt, difficulty concentrating, disturbed sleep, (Hypo) Manic Symptoms:  Delusions, Distractibility, Elevated Mood, Flight of Ideas, Impulsivity, Labiality of Mood, Anxiety Symptoms:  Excessive Worry, Psychotic Symptoms:  Delusions, PTSD Symptoms: Negative Total Time spent with patient: 45 minutes  Past Psychiatric History: Patient with hx of IP admissions at [facility] as well as crisis unit at [place] . Pt has a hx of suicide attempt by OD , but states did not completely follow through with  it. Follows up with PCP for medications. Past hx of MDD, psychosis.   Is the patient at risk to self? Yes.    Has the patient been a risk to self in the past 6 months? Yes.    Has the patient been a risk to self within the distant past? Yes.    Is the patient a risk to others? Yes.    Has the patient been a risk to others in the past 6 months? No.  Has the patient been a risk to others within the distant past? No.   Prior Inpatient Therapy:   Prior Outpatient Therapy:    Alcohol Screening: 1. How often do you have a drink containing alcohol?: Monthly or less 2. How many drinks containing alcohol do you have on a typical day when you are drinking?: 1 or 2 3. How often do you have six or more drinks on one occasion?: Never Preliminary Score: 0 9. Have you or someone else been injured as a result of your drinking?: No 10. Has a relative or friend or a doctor or another health worker been concerned about your drinking or suggested you cut down?: No Alcohol Use Disorder Identification Test Final Score (AUDIT): 1 Brief Intervention: AUDIT score less than 7 or less-screening does not suggest unhealthy drinking-brief intervention not indicated Substance Abuse History in the last 12 months:  Yes.   alcohol as noted above  Consequences of Substance Abuse: Medical Consequences:  IP admissions like this one Family Consequences:  relational issues Previous Psychotropic Medications: Yes , celexa Psychological Evaluations: Yes  Past Medical History:  Past Medical History:  Diagnosis Date  . Anxiety   . Depression   . Hypertension   . Substance abuse    in remission    Past Surgical History:  Procedure Laterality Date  . COLONOSCOPY N/A 01/01/2013   Procedure: COLONOSCOPY;  Surgeon: Rogene Houston, MD;  Location: [facility] ENDO [facility];  Service: Endoscopy;  Laterality: N/A;  830  . FOOT SURGERY    . htn    . TONSILLECTOMY     childhood   Family History:  Family History  Problem Relation Age  of Onset  . Depression Maternal Grandfather   . Dementia Maternal Grandfather        and MGGM, his mother  . OCD Mother   . Alcohol abuse Maternal Uncle   . Drug abuse Maternal Uncle   . Cancer Paternal Grandmother        colon cancer  . Colon cancer Paternal Grandmother   . ADD / ADHD Neg Hx   . Anxiety disorder Neg Hx   . Bipolar disorder Neg Hx   . Paranoid behavior Neg Hx   . Schizophrenia Neg Hx   . Seizures Neg Hx   . Sexual abuse Neg Hx   . Physical abuse Neg Hx    Family Psychiatric  History: Paternal grand father committed suicide, he did not even meet him . Tobacco Screening: Have you used any form of tobacco in the last 30 days? (Cigarettes, Smokeless Tobacco, Cigars, and/or Pipes): Yes Tobacco use, Select all that apply: 5 or more cigarettes per day Are you interested in  Tobacco Cessation Medications?: Yes, will notify MD for an order Counseled patient on smoking cessation including recognizing danger situations, developing coping skills and basic information about quitting provided: Yes Social History: Pt is married , employed , lives at [place] , has two kids- 62 and 65 . History  Alcohol Use  . 0.6 oz/week  . 1 Cans of beer per week    Comment: BAL not available at time of assessment     History  Drug Use  . Types: Marijuana    Comment: Pt denied current use; UDS NA at time of assessment    Additional Social History:                           Allergies:  No Known Allergies Lab Results: No results found for this or any previous visit (from the past 48 hour(s)).  Blood Alcohol level:  Lab Results  Component Value Date   [facility] <5 09/02/2016   ETH <11 09/60/4540    Metabolic Disorder Labs:  Lab Results  Component Value Date   HGBA1C 5.9 (H) 04/04/2016   MPG 126 10/05/2015   MPG 128 (H) 10/12/2014   No results found for: PROLACTIN Lab Results  Component Value Date   CHOL 201 (H) 10/05/2015   TRIG 148 10/05/2015   HDL 46 10/05/2015    CHOLHDL 4.4 10/05/2015   VLDL 30 10/05/2015   LDLCALC 125 10/05/2015   LDLCALC 144 (H) 10/12/2014    Current Medications: Current Facility-Administered Medications  Medication Dose Route Frequency Provider Last Rate Last Dose  . acetaminophen (TYLENOL) tablet 650 mg  650 mg Oral Q6H PRN Okonkwo, Justina A, NP      . alum & mag hydroxide-simeth (MAALOX/MYLANTA) 200-200-20 MG/5ML suspension 30 mL  30 mL Oral Q4H PRN Okonkwo, Justina A, NP      . citalopram (CELEXA) tablet 10 mg  10 mg Oral Daily Lindell Spar I, NP   10 mg at 09/05/16 0748  . hydrOXYzine (ATARAX/VISTARIL) tablet 25 mg  25 mg Oral TID PRN Lu Duffel, Justina A, NP      . lamoTRIgine (LAMICTAL) tablet 25 mg  25 mg Oral Daily Linkin Vizzini, MD      . lisinopril (PRINIVIL,ZESTRIL) tablet 10 mg  10 mg Oral Daily Lindell Spar I, NP   10 mg at 09/05/16 0748  . LORazepam (ATIVAN) tablet 1 mg  1 mg Oral Q6H PRN Manoah Deckard, MD      . magnesium hydroxide (MILK OF MAGNESIA) suspension 30 mL  30 mL Oral Daily PRN Okonkwo, Justina A, NP      . nicotine (NICODERM CQ - dosed in mg/24 hours) patch 21 mg  21 mg Transdermal Daily Onika Gudiel, Ria Clock, MD   21 mg at 09/05/16 0748  . risperiDONE (RISPERDAL M-TABS) disintegrating tablet 1 mg  1 mg Oral TID PRN Ursula Alert, MD      . risperiDONE (RISPERDAL) tablet 2 mg  2 mg Oral QHS Nwoko, Agnes I, NP   2 mg at 09/04/16 2157   PTA Medications: Prescriptions Prior to Admission  Medication Sig Dispense Refill Last Dose  . citalopram (CELEXA) 10 MG tablet Take 1 tablet (10 mg total) by mouth daily. 90 tablet 3 couple months ago  . cyclobenzaprine (FLEXERIL) 10 MG tablet Take 1 tablet (10 mg total) by mouth 3 (three) times daily as needed for muscle spasms. 30 tablet 0 maybe few days ago  . lisinopril (PRINIVIL,ZESTRIL) 10 MG tablet Take  1 tablet (10 mg total) by mouth daily. (Patient not taking: Reported on 09/02/2016) 30 tablet 3 Not Taking at Unknown time  . risperiDONE (RISPERDAL) 2 MG tablet  Take 1 tablet (2 mg total) by mouth at bedtime. 90 tablet 3 couple months ago  . temazepam (RESTORIL) 15 MG capsule Take 1 capsules at bedtime for sleep as needed (Patient taking differently: Take 15 mg by mouth at bedtime as needed for sleep. ) 90 capsule 1 maybe few nights ago    Musculoskeletal: Strength & Muscle Tone: within normal limits Gait & Station: normal Patient leans: N/A  Psychiatric Specialty Exam: Physical Exam  Review of Systems  Psychiatric/Behavioral: Positive for substance abuse. The patient is nervous/anxious and has insomnia.   All other systems reviewed and are negative.   Blood pressure 130/90, pulse (!) 109, temperature 99 F (37.2 C), temperature source Oral, resp. rate 20, height 6\' 3"  (1.905 m), weight (!) 179.2 kg (395 lb).Body mass index is 49.37 kg/m.  General Appearance: Casual  Eye Contact:  Fair  Speech:  Clear and Coherent  Volume:  Normal  Mood:  Anxious  Affect:  Congruent  Thought Process:  Goal Directed and Descriptions of Associations: Circumstantial  Orientation:  Full (Time, Place, and Person)  Thought Content:  Delusions and Rumination  Suicidal Thoughts:  No  Homicidal Thoughts:  No  Memory:  Immediate;   Fair Recent;   Fair Remote;   Fair  Judgement:  Fair  Insight:  Shallow  Psychomotor Activity:  Restlessness  Concentration:  Concentration: Fair and Attention Span: Fair  Recall:  [employer] of Knowledge:  Fair  Language:  Fair  Akathisia:  No  Handed:  Right  AIMS (if indicated):     Assets:  Communication Skills Desire for Improvement  ADL's:  Intact  Cognition:  WNL  Sleep:  Number of Hours: 2    Treatment Plan Summary:Patient with mood lability, grandiose delusions, manic/hypomanic episodes on and off , several psychosocial stressors , presents as cooperative , motivated to get help. Pt also with alcohol abuse on and off . Pt will benefit from treatment. Daily contact with patient to assess and evaluate symptoms and  progress in treatment, Medication management and Plan see below Patient will benefit from inpatient treatment and stabilization.  Estimated length of stay is 5-7 days.  Reviewed past medical records,treatment plan.  Will continue Risperidone 2 mg po qhs for psychosis/mood sx. Add Lamictal 25 mg po daily for mood lability. Start CIWA Francee Gentile prn for alcohol withdrawal sx. Discuss sleep aid with patient for sleep issues , pt feels trazodone is not helpful. Provided substance abuse counseling. Will continue to monitor vitals ,medication compliance and treatment side effects while patient is here.  Will monitor for medical issues as well as call consult as needed.  Reviewed labs - K+ - low - will repeat , AST - elevated at 44, ,will order tsh, lipid panel, hba1c, pl.  EKG reviewed - RSR" pattern , qtc - wnl . CSW will start working on disposition.  Patient to participate in therapeutic milieu .      Observation Level/Precautions:  15 minute checks    Psychotherapy:  Individual and group therapy     Consultations:  CSW  Discharge Concerns:Stability and safety         Physician Treatment Plan for Primary Diagnosis: Bipolar disorder, curr episode mixed, severe, with psychotic features ([place]) Long Term Goal(s): Improvement in symptoms so as ready for discharge  Short Term Goals:  Ability to identify changes in lifestyle to reduce recurrence of condition will improve, Ability to verbalize feelings will improve and Compliance with prescribed medications will improve  Physician Treatment Plan for Secondary Diagnosis: Principal Problem:   Bipolar disorder, curr episode mixed, severe, with psychotic features ([place]) Active Problems:   Generalized anxiety disorder   Alcohol use disorder, severe, dependence ([place])  Long Term Goal(s): Improvement in symptoms so as ready for discharge  Short Term Goals: Ability to identify changes in lifestyle to reduce recurrence of condition will improve,  Ability to verbalize feelings will improve, Compliance with prescribed medications will improve and Ability to identify triggers associated with substance abuse/mental health issues will improve  I certify that inpatient services furnished can reasonably be expected to improve the patient's condition.    Ursula Alert, MD 6/13/201811:34 AM

## 2016-09-05 NOTE — BHH Suicide Risk Assessment (Signed)
[facility] Admission Suicide Risk Assessment   Nursing information obtained from:  Patient Demographic factors:  Male Current Mental Status:  NA Loss Factors:  NA Historical Factors:  NA Risk Reduction Factors:  Religious beliefs about death, Living with another person, especially a relative, Positive social support  Total Time spent with patient: 30 minutes Principal Problem: Bipolar disorder, curr episode mixed, severe, with psychotic features ([place]) Diagnosis:   Patient Active Problem List   Diagnosis Date Noted  . Alcohol use disorder, severe, dependence ([place]) [F10.20] 09/05/2016  . Bipolar disorder, curr episode mixed, severe, with psychotic features ([place]) [F31.64] 09/05/2016  . Chronic insomnia [F51.04] 08/24/2015  . Borderline diabetes [R73.03] 10/19/2014  . Preoperative examination [Z01.818] 08/03/2013  . Incomplete right bundle branch block [I45.10] 08/03/2013  . Morbid obesity ([place]) [E66.01] 05/10/2013  . Unspecified sleep apnea [G47.30] 11/11/2012  . Routine general medical examination at a health care facility [Z00.00] 11/11/2012  . Chronic diarrhea [K52.9] 11/11/2012  . Rectal bleeding [K62.5] 11/11/2012  . Boil of buttock [L02.32] 11/11/2012  . Taking multiple medications for chronic disease [R69] 05/06/2012  . HTN (hypertension) [I10] 08/14/2011  . Mild hyperlipidemia [E78.5] 08/14/2011  . Tobacco user [Z72.0] 08/14/2011  . Generalized anxiety disorder [F41.1] 07/02/2011   Subjective Data: Please see H&P.   Continued Clinical Symptoms:  Alcohol Use Disorder Identification Test Final Score (AUDIT): 1 The "Alcohol Use Disorders Identification Test", Guidelines for Use in Primary Care, Second Edition.  [facility]). Score between 0-7:  no or low risk or alcohol related problems. Score between 8-15:  moderate risk of alcohol related problems. Score between 16-19:  high risk of alcohol related problems. Score 20 or above:  warrants further diagnostic  evaluation for alcohol dependence and treatment.   CLINICAL FACTORS:   Bipolar Disorder:   Mixed State Alcohol/Substance Abuse/Dependencies   Musculoskeletal: Strength & Muscle Tone: within normal limits Gait & Station: normal Patient leans: N/A  Psychiatric Specialty Exam: Physical Exam  ROS  Blood pressure 130/90, pulse (!) 109, temperature 99 F (37.2 C), temperature source Oral, resp. rate 20, height 6\' 3"  (1.905 m), weight (!) 179.2 kg (395 lb).Body mass index is 49.37 kg/m.                  Please see H&P.                                         COGNITIVE FEATURES THAT CONTRIBUTE TO RISK:  Closed-mindedness, Polarized thinking and Thought constriction (tunnel vision)    SUICIDE RISK:   Mild:  Suicidal ideation of limited frequency, intensity, duration, and specificity.  There are no identifiable plans, no associated intent, mild dysphoria and related symptoms, good self-control (both objective and subjective assessment), few other risk factors, and identifiable protective factors, including available and accessible social support.  PLAN OF CARE: Please see H&P.   I certify that inpatient services furnished can reasonably be expected to improve the patient's condition.   Darvell Monteforte, MD 09/05/2016, 10:54 AM

## 2016-09-05 NOTE — Progress Notes (Signed)
Recreation Therapy Notes  Date: 09/05/16 Time: 1000 Location: 500 Hall Dayroom  Group Topic: Leisure Education  Goal Area(s) Addresses:  Patient will identify positive leisure activities.  Patient will identify one positive benefit of participation in leisure activities.   Behavioral Response: Engaged  Intervention: Magazines, scissors, glue sticks, Architect paper  Activity: Leisure Programmer, systems.  Patients were to create their own recreation center and develop programs that would be offered at their centers.  Each recreation center came equipped with a building, ball field and pool.  Using the resources provided, patients were to come up with activities that can be offered at each place.  Education:  Leisure Education, Dentist  Education Outcome: Acknowledges education/In group clarification offered/Needs additional education  Clinical Observations/Feedback: Pt stated leisure is time to relax.  Pt missed most of group because he was meeting with the doctor.  Pt stated he would name his center as MBS (mind, body and soul) Recreation Center.  Pt would offer an aquatic center, field games.  Pt stated their them would be "helping is what we do best".  Pt also explained that "taking of your mind, body and soul is essential to your overall health and when is off center, it throws your whole being out of off".   Victorino Sparrow, LRT/CTRS         Victorino Sparrow A 09/05/2016 12:39 PM

## 2016-09-05 NOTE — Progress Notes (Signed)
Recreation Therapy Notes  INPATIENT RECREATION THERAPY ASSESSMENT  Patient Details Name: Jesse Mayer MRN: [id number] DOB: 17-May-1970 Today's Date: 10/01/2016  Patient Stressors: Death, Work, Other (Comment) (Aunt has cancer, a lump was found on his mother but it was benign)  Pt stated he was here because he stopped taking his medication and had a psychosis episode. Pt stated his aunt died a month ago.  Coping Skills:   Talking, Music, Sports  Personal Challenges:  (None identified)  Leisure Interests (2+):  Social - Family, Community - Other (Comment), Music - Listen, Individual - Other (Comment) (Help people; church)  Awareness of Community Resources:  Yes  Community Resources:  [place]  Current Use: Yes  Patient Strengths:  Compassion; dedication to faith  Patient Identified Areas of Improvement:  Learn how to relax; take better care of body/better diet  Current Recreation Participation:  Everyday  Patient Goal for Hospitalization:  "Get well mentally"  [place] of Residence:  [place] of Residence:  [place]  Current [place] (including self-harm):  No  Current [place]:  No  Consent to Intern Participation: N/A   Victorino Sparrow, LRT/CTRS  Victorino Sparrow A 10/01/16, 3:17 PM

## 2016-09-06 DIAGNOSIS — F1290 Cannabis use, unspecified, uncomplicated: Secondary | ICD-10-CM

## 2016-09-06 DIAGNOSIS — F17213 Nicotine dependence, cigarettes, with withdrawal: Secondary | ICD-10-CM

## 2016-09-06 DIAGNOSIS — F419 Anxiety disorder, unspecified: Secondary | ICD-10-CM

## 2016-09-06 NOTE — Progress Notes (Signed)
Pt has been in the dayroom watching TV all evening with minimal interaction with his peers.  He said that today has been a good day, and that he spoke to the doctor about his sleep medications.  He hopes to be able to sleep tonight as he had poor sleep last night.  Pt makes his needs known to staff.  He has been pleasant and cooperative.  Support and encouragement offered.  Discharge plans are in process.  Pt plans to return home when discharged.  Safety maintained with q15 minute checks.

## 2016-09-06 NOTE — Progress Notes (Signed)
Recreation Therapy Notes  Date: 09/06/16 Time: 1000 Location: 500 Hall Dayroom  Group Topic: Wellness  Goal Area(s) Addresses:  Patient will define components of whole wellness. Patient will verbalize benefit of whole wellness.  Behavioral Response: Engaged  Intervention: 2 small beach balls, chairs  Activity: Group Juggle.   Patients were arranged in a circle.  Patients were given one ball to start.  Patients were to get the ball going in a good rotation without it coming to a stop, but it the ball could bounce off the ground.  LRT would time how long the ball was in play.  Once the first ball has been in rotation for a while, LRT would add a second ball to the mix.  Patients were to keep both balls going for as long as they could.  Anytime the balls stopped, the time would start over.   Education: Wellness, Dentist.   Education Outcome: Acknowledges education/In group clarification offered/Needs additional education.   Clinical Observations/Feedback: Pt was pleasant but quiet.  Pt was active and engaged throughout activity.  Pt expressed he was able to get a workout.   Victorino Sparrow, LRT/CTRS         Victorino Sparrow A 09/06/2016 11:55 AM

## 2016-09-06 NOTE — BHH Group Notes (Signed)
[facility] Mental Health Association Group Therapy  09/06/2016 , 12:04 PM    Type of Therapy:  Mental Health Association Presentation  Participation Level:  Active  Participation Quality:  Attentive  Affect:  Blunted  Cognitive:  Oriented  Insight:  Limited  Engagement in Therapy:  Engaged  Modes of Intervention:  Discussion, Education and Socialization  Summary of Progress/Problems:  Jesse Mayer from [place] came to present his recovery story and play the guitar.  Stayed the entire time, engaged throughout.  Asked many questions and shared from personal experience.  Jesse Mayer 09/06/2016 , 12:04 PM

## 2016-09-06 NOTE — Progress Notes (Signed)
DAR NOTE: Patient presents with bright affect and jovial mood. Pt has been in the dayroom most of the time wacthing and interacting with peers.  Denies pain, auditory and visual hallucinations.  Rates depression at 0, hopelessness at 0, and anxiety at 0.  Maintained on routine safety checks.  Medications given as prescribed.  Support and encouragement offered as needed.  Attended group and participated.  States goal for today is "to prepare to go home."  Patient observed socializing with peers in the dayroom.  Offered no complaint.

## 2016-09-06 NOTE — Progress Notes (Signed)
[facility] MD Progress Note  09/06/2016 2:18 PM Jesse Mayer  MRN:  [id number]  Subjective: Jesse Mayer reports, "I'm actually feeling a lot better today now that I have started back on my medicines. And, I slept better on Ambien last night than I had in many months. About the antipsychotic injectable, I have decided to wait till I get discharged, see how I'm doing, then will decide. I have talked to my wife about it this morning & she agreed with me. I will take my medicine after I get out of the hospital because I want to stay healthy".  Objective:  Jesse Mayer is seen, chart reviewed. Discussed this case with the treatment team. Jesse Mayer is visible on the unit. Jesse Mayer attending group milieu. He presents this morning alert, oriented & aware of situation. He described the event that led to his hospitalization. He says he became psychotic after learning the news that his mother has some growth on her lymph nodes. He says, all of a sudden, he felt like he was the lion of [place], chosen by God to do certain things on earth. He says this is the 3rd time he has been in crisis mentally & has to be hospitalized. He says he is now committed to making sure that his mental health is in check. However, he says he has been given the idea of trying an antipsychotic injectable. He wants to wait till he is discharged from the hospital. Will continue to take medications orally, follow-up with his outpatient psychiatrist, then, if all is not well with him mentally, he will then consider receiving the monthly antipsychotic medication shots. He denies any SIHI, AVH, delusional thoughts or paranoia. He does not appear to be responding to any internal stimuli. He denies any adverse effects from medications.  Principal Problem: Bipolar disorder, curr episode mixed, severe, with psychotic features ([place])  Diagnosis:   Patient Active Problem List   Diagnosis Date Noted  . Alcohol use disorder, severe, dependence ([place]) [F10.20] 09/05/2016  .  Bipolar disorder, curr episode mixed, severe, with psychotic features ([place]) [F31.64] 09/05/2016  . Chronic insomnia [F51.04] 08/24/2015  . Borderline diabetes [R73.03] 10/19/2014  . Preoperative examination [Z01.818] 08/03/2013  . Incomplete right bundle branch block [I45.10] 08/03/2013  . Morbid obesity ([place]) [E66.01] 05/10/2013  . Unspecified sleep apnea [G47.30] 11/11/2012  . Routine general medical examination at a health care facility [Z00.00] 11/11/2012  . Chronic diarrhea [K52.9] 11/11/2012  . Rectal bleeding [K62.5] 11/11/2012  . Boil of buttock [L02.32] 11/11/2012  . Taking multiple medications for chronic disease [R69] 05/06/2012  . HTN (hypertension) [I10] 08/14/2011  . Mild hyperlipidemia [E78.5] 08/14/2011  . Tobacco user [Z72.0] 08/14/2011  . Generalized anxiety disorder [F41.1] 07/02/2011   Total Time spent with patient: 25 minutes  Past Psychiatric History: Bipolar disorder, Substance use disorder.  Past Medical History:  Past Medical History:  Diagnosis Date  . Anxiety   . Depression   . Hypertension   . Substance abuse    in remission    Past Surgical History:  Procedure Laterality Date  . COLONOSCOPY N/A 01/01/2013   Procedure: COLONOSCOPY;  Surgeon: Rogene Houston, MD;  Location: [facility];  Service: Endoscopy;  Laterality: N/A;  830  . FOOT SURGERY    . htn    . TONSILLECTOMY     childhood   Family History:  Family History  Problem Relation Age of Onset  . Depression Maternal Grandfather   . Dementia Maternal Grandfather  and MGGM, his mother  . OCD Mother   . Alcohol abuse Maternal Uncle   . Drug abuse Maternal Uncle   . Cancer Paternal Grandmother        colon cancer  . Colon cancer Paternal Grandmother   . ADD / ADHD Neg Hx   . Anxiety disorder Neg Hx   . Bipolar disorder Neg Hx   . Paranoid behavior Neg Hx   . Schizophrenia Neg Hx   . Seizures Neg Hx   . Sexual abuse Neg Hx   . Physical abuse Neg Hx    Family Psychiatric   History: See H&P  Social History:  History  Alcohol Use  . 0.6 oz/week  . 1 Cans of beer per week    Comment: BAL not available at time of assessment     History  Drug Use  . Types: Marijuana    Comment: Pt denied current use; UDS NA at time of assessment    Social History   Social History  . Marital status: Married    Spouse name: N/A  . Number of children: N/A  . Years of education: N/A   Social History Main Topics  . Smoking status: Current Every Day Smoker    Packs/day: 1.00    Years: 20.00    Types: Cigarettes  . Smokeless tobacco: Never Used     Comment: 1 pack every 3rd day  . Alcohol use 0.6 oz/week    1 Cans of beer per week     Comment: BAL not available at time of assessment  . Drug use: Yes    Types: Marijuana     Comment: Pt denied current use; UDS NA at time of assessment  . Sexual activity: Not Asked     Comment: no birth control/no protection   Other Topics Concern  . None   Social History Narrative  . None   Additional Social History:      Sleep: Fair  Appetite:  Fair  Current Medications: Current Facility-Administered Medications  Medication Dose Route Frequency Provider Last Rate Last Dose  . acetaminophen (TYLENOL) tablet 650 mg  650 mg Oral Q6H PRN Okonkwo, Justina A, NP      . alum & mag hydroxide-simeth (MAALOX/MYLANTA) 200-200-20 MG/5ML suspension 30 mL  30 mL Oral Q4H PRN Okonkwo, Justina A, NP      . citalopram (CELEXA) tablet 10 mg  10 mg Oral Daily Nwoko, Agnes I, NP   10 mg at 09/06/16 0759  . hydrOXYzine (ATARAX/VISTARIL) tablet 25 mg  25 mg Oral TID PRN Lu Duffel, Justina A, NP      . lamoTRIgine (LAMICTAL) tablet 25 mg  25 mg Oral Daily Eappen, Saramma, MD   25 mg at 09/06/16 0759  . lisinopril (PRINIVIL,ZESTRIL) tablet 10 mg  10 mg Oral Daily Lindell Spar I, NP   10 mg at 09/06/16 0759  . LORazepam (ATIVAN) tablet 1 mg  1 mg Oral Q6H PRN Eappen, Saramma, MD      . magnesium hydroxide (MILK OF MAGNESIA) suspension 30 mL  30 mL  Oral Daily PRN Okonkwo, Justina A, NP      . nicotine (NICODERM CQ - dosed in mg/24 hours) patch 21 mg  21 mg Transdermal Daily Eappen, Saramma, MD   21 mg at 09/06/16 0800  . risperiDONE (RISPERDAL M-TABS) disintegrating tablet 1 mg  1 mg Oral TID PRN Eappen, Ria Clock, MD      . risperiDONE (RISPERDAL) tablet 2 mg  2 mg Oral QHS  Lindell Spar I, NP   2 mg at 09/05/16 2234  . zolpidem (AMBIEN) tablet 5 mg  5 mg Oral QHS PRN Ursula Alert, MD   5 mg at 09/05/16 2234   Lab Results: No results found for this or any previous visit (from the past 48 hour(s)).  Blood Alcohol level:  Lab Results  Component Value Date   [facility] <5 09/02/2016   ETH <11 28/41/3244   Metabolic Disorder Labs: Lab Results  Component Value Date   HGBA1C 5.9 (H) 04/04/2016   MPG 126 10/05/2015   MPG 128 (H) 10/12/2014   No results found for: PROLACTIN Lab Results  Component Value Date   CHOL 201 (H) 10/05/2015   TRIG 148 10/05/2015   HDL 46 10/05/2015   CHOLHDL 4.4 10/05/2015   VLDL 30 10/05/2015   LDLCALC 125 10/05/2015   LDLCALC 144 (H) 10/12/2014   Physical Findings: AIMS:  , ,  ,  ,    CIWA:  CIWA-Ar Total: 0 COWS:     Musculoskeletal: Strength & Muscle Tone: within normal limits Gait & Station: normal Patient leans: N/A  Psychiatric Specialty Exam: Physical Exam: Nurse notes & Vital signs reviewed.  Review of Systems  Psychiatric/Behavioral: Positive for depression, hallucinations (Hx. Psychosis) and substance abuse (Hx, drug use). Negative for memory loss and suicidal ideas. The patient has insomnia ("Improving"). The patient is not nervous/anxious.     Blood pressure 140/80, pulse 76, temperature 97.6 F (36.4 C), temperature source Oral, resp. rate 20, height 6\' 3"  (1.905 m), weight (!) 179.2 kg (395 lb).Body mass index is 49.37 kg/m.  General Appearance: Casual  Eye Contact:  Fair  Speech:  Clear and Coherent  Volume:  Normal  Mood: "Improving"  Affect:  Congruent  Thought Process:  Goal  Directed and Descriptions of Associations: Circumstantial  Orientation:  Full (Time, Place, and Person)  Thought Content:  Delusions and Rumination  Suicidal Thoughts:  No  Homicidal Thoughts:  No  Memory:  Immediate;   Fair Recent;   Fair Remote;   Fair  Judgement:  Fair  Insight:  Shallow  Psychomotor Activity: Normal  Concentration:  Concentration: Fair and Attention Span: Fair  Recall: Good  Fund of Knowledge:  Fair  Language:  Fair  Akathisia:  No  Handed:  Right  AIMS (if indicated):     Assets:  Communication Skills Desire for Improvement  ADL's:  Intact  Cognition:  WNL  Sleep:  Number of Hours: 5.25     Treatment Plan Summary: Patient continues to require & receive mood stabilization treatments. No evidence of psychosis. No evidence of mania. No dangerousness. SW is working on the disposition plan.    Psychiatric: Bipolar disorder. Substance Induced Mood Disorder (SUD)  Medical: HTN. Continue Lisinopril 10 mg daily. Will continue monitor for any symptoms & treat on a prn basis.  Psychosocial:  Hx. Substance use disorder. Familial stressors. Will encourage group counseling attendance & participation.  PLAN: 1.09-06-16: No changes made on the current plan of care, continue current regimen as recommended.  Mood control/stabilization:  Continue Risperdal 2 mg Q hs. Continue Lamictal 25 mg daily.  Depression: Continue Citalopram 10 mg daily.  Anxiety/CIWA: Continue Lorazepam 1 mg Q 6 hours prn. Continue Hydroxyzine 25 mg Q 8 hours prn.  Insomnia: Continue Ambien 5 mg prn Q hs.  Nicotine Withdrawal symptoms: Continue Nicotine patch 21 mg Q 24 hours..  2. Continue to monitor mood, behavior and interaction with peers  Encarnacion Slates, NP, PMHNP, FNP-BC  09/06/2016, 2:18 PM   Agree with NP Progress Note

## 2016-09-07 DIAGNOSIS — F17210 Nicotine dependence, cigarettes, uncomplicated: Secondary | ICD-10-CM

## 2016-09-07 MED ORDER — ZOLPIDEM TARTRATE 5 MG PO TABS: 5.0000 mg | ORAL_TABLET | Freq: Every evening | ORAL | 0 refills | Status: DC | PRN

## 2016-09-07 MED ORDER — POTASSIUM CHLORIDE CRYS ER 20 MEQ PO TBCR
40.0000 meq | EXTENDED_RELEASE_TABLET | Freq: Once | ORAL | Status: AC
  Administered 2016-09-07: 40 meq via ORAL
  Filled 2016-09-07: qty 2

## 2016-09-07 MED ORDER — NICOTINE 21 MG/24HR TD PT24: 21.0000 mg | MEDICATED_PATCH | Freq: Every day | TRANSDERMAL | 0 refills | Status: DC

## 2016-09-07 MED ORDER — LAMOTRIGINE 25 MG PO TABS: 25.0000 mg | ORAL_TABLET | Freq: Every day | ORAL | 0 refills | Status: DC

## 2016-09-07 MED ORDER — HYDROXYZINE HCL 25 MG PO TABS: 25.0000 mg | ORAL_TABLET | Freq: Three times a day (TID) | ORAL | 0 refills | Status: DC | PRN

## 2016-09-07 NOTE — Progress Notes (Signed)
Pt reports he had a good day.  He continues to do well since he has been back on his home meds. His thoughts are clear and goal oriented.  He denies SI/HI/AVH.  He makes his needs known to staff.  He is polite and appropriate.  Support and encouragement offered.  Discharge plans are in process.  Pt plans to return home at discharge.  Safety maintained with q15 minute checks.

## 2016-09-07 NOTE — Progress Notes (Signed)
Pt discharged home with wife. Pt was ambulatory, stable and appreciative at that time. All papers and prescriptions were given and valuables returned. Verbal understanding expressed. Denies SI/HI and A/VH. Pt given opportunity to express concerns and ask questions.  

## 2016-09-07 NOTE — Progress Notes (Signed)
  [facility] Adult Case Management Discharge Plan :  Will you be returning to the same living situation after discharge:  Yes,  returning home. At discharge, do you have transportation home?: Yes,  family will pick patient up. Do you have the ability to pay for your medications: Yes,  [facility] insurance.  Release of information consent forms completed and in the chart;  [name] signature needed at discharge.  Patient to Follow up at: Follow-up Information    [facility] on 10/09/2016.   Why:  Tuesday at 10:20 with Dr Vena Rua. Bring along ID, insurance card, hospital d/c paperwork. I asked that you be put on a cancellation list in case there is a sooner appointment that becomes available. Contact information: [place] [address] [phone]           Next level of care provider has access to [place] and Suicide Prevention discussed: Yes,  SPE completed with patient.  Have you used any form of tobacco in the last 30 days? (Cigarettes, Smokeless Tobacco, Cigars, and/or Pipes): Yes  Has patient been referred to the Quitline?: Patient refused referral  Patient has been referred for addiction treatment: Pt. refused referral  Emilie Rutter, MSW, LCSW-A 09/07/2016, 9:42 AM

## 2016-09-07 NOTE — Progress Notes (Signed)
Pt did not attend the evening karaoke group. Pt stayed back on the unit during group. Clint Bolder, NT 09/06/16

## 2016-09-07 NOTE — [facility] Group Notes (Signed)
[facility] LCSW Group Therapy   09/07/2016 1:30 PM   Type of Therapy: Group Therapy   Participation Level: Active   Participation Quality: Attentive, Sharing and Supportive   Affect: Appropriate   Cognitive: Alert and Oriented   Insight: Developing/Improving and Engaged   Engagement in Therapy: Developing/Improving and Engaged   Modes of Intervention: Clarification, Confrontation, Discussion, Education, Exploration, Limit-setting, Orientation, Problem-solving, Rapport Building, Art therapist, Socialization and Support   Summary of Progress/Problems: The topic for today was feelings about relapse. Pt discussed what relapse prevention is to them and identified triggers that they are on the path to relapse. Pt processed their feeling towards relapse and was able to relate to peers. Pt discussed coping skills that can be used for relapse prevention. Patient engaged throughout group. He stated triggers for relapse and ways to prevent further relapse at discharge. Patient identified religion and spirituality as ways to reduce relapse.   Glorious Peach, MSW, LCSW-A 09/07/2016, 3:09PM

## 2016-09-07 NOTE — Discharge Summary (Signed)
Physician Discharge Summary Note  Patient:  Jesse Mayer is an 46 y.o., male MRN:  [id number] DOB:  1970/07/03 Patient phone:  [phone] (home)  Patient address:   [place] [address],  Total Time spent with patient: 30 minutes  Date of Admission:  09/04/2016 Date of Discharge: 09/07/2016  Reason for Admission: Per HPI-  Jesse Mayer is a 65 y old AAM, who is employed , lives with wife and two children at [place] , has a hx of depression, mood sx, psychosis and anxiety issues, presented with worsening psychosis/agitation.Patient seen and chart reviewed.Discussed patient with treatment team. Pt today seen as calm, reported that he was going through a lot of stressors. Pt reported that his aunt passed away a month ago suddenly , then his mother got diagnosed with a growth in her lymph node and his other aunt has stage 4 cancer. Pt is the only child , and when his mother got diagnosed with a growth in the lymph node he got so anxious and overwhelmed. He reported that he started drinking a whole lot. He reports he can drink up to 12 beers per day , but that happens sporadically and only when his drinking gets worse. He reported that he started getting all these psychotic episodes and not remembering them. He had some outbursts at church and he could not remember them later. He felt grandiose that he was the "Webb." Pt reported that he felt like he was special and wanted to help every one. Pt reported some mood swings , periods when is energetic , labile and hyperactive as well as having sleep issues. He can go from having 5 hrs of sleep to an hr of sleep some nights . He states he may have been hypomanic or manic but they do not last too long. Pt reports that he takes Restoril for sleep issues , but makes sure he does not mix it with alcohol. However , he is not able to say for sure if he can do that all the time. He also takes celexa for anxiety sx , he is OK and feels  better when he is on celexa since it helps with his anxiety issues. Pt reports he has had prior admissions to IP units in the past , mostly for psychotic episodes similar to this one. He is being followed by his PCP who prescribes him these medications. Pt reports he is employed , married and his family is supportive.  Principal Problem: Bipolar disorder, curr episode mixed, severe, with psychotic features [facility]) Discharge Diagnoses: Patient Active Problem List   Diagnosis Date Noted  . Alcohol use disorder, severe, dependence ([place]) [F10.20] 09/05/2016  . Bipolar disorder, curr episode mixed, severe, with psychotic features ([place]) [F31.64] 09/05/2016  . Chronic insomnia [F51.04] 08/24/2015  . Borderline diabetes [R73.03] 10/19/2014  . Preoperative examination [Z01.818] 08/03/2013  . Incomplete right bundle branch block [I45.10] 08/03/2013  . Morbid obesity ([place]) [E66.01] 05/10/2013  . Unspecified sleep apnea [G47.30] 11/11/2012  . Routine general medical examination at a health care facility [Z00.00] 11/11/2012  . Chronic diarrhea [K52.9] 11/11/2012  . Rectal bleeding [K62.5] 11/11/2012  . Boil of buttock [L02.32] 11/11/2012  . Taking multiple medications for chronic disease [R69] 05/06/2012  . HTN (hypertension) [I10] 08/14/2011  . Mild hyperlipidemia [E78.5] 08/14/2011  . Tobacco user [Z72.0] 08/14/2011  . Generalized anxiety disorder [F41.1] 07/02/2011    Past Psychiatric History:  Past Medical History:  Past Medical History:  Diagnosis Date  .  Anxiety   . Depression   . Hypertension   . Substance abuse    in remission    Past Surgical History:  Procedure Laterality Date  . COLONOSCOPY N/A 01/01/2013   Procedure: COLONOSCOPY;  Surgeon: Rogene Houston, MD;  Location: [facility];  Service: Endoscopy;  Laterality: N/A;  830  . FOOT SURGERY    . htn    . TONSILLECTOMY     childhood   Family History:  Family History  Problem Relation Age of Onset  . Depression  Maternal Grandfather   . Dementia Maternal Grandfather        and MGGM, his mother  . OCD Mother   . Alcohol abuse Maternal Uncle   . Drug abuse Maternal Uncle   . Cancer Paternal Grandmother        colon cancer  . Colon cancer Paternal Grandmother   . ADD / ADHD Neg Hx   . Anxiety disorder Neg Hx   . Bipolar disorder Neg Hx   . Paranoid behavior Neg Hx   . Schizophrenia Neg Hx   . Seizures Neg Hx   . Sexual abuse Neg Hx   . Physical abuse Neg Hx    Family Psychiatric  History: Social History:  History  Alcohol Use  . 0.6 oz/week  . 1 Cans of beer per week    Comment: BAL not available at time of assessment     History  Drug Use  . Types: Marijuana    Comment: Pt denied current use; UDS NA at time of assessment    Social History   Social History  . Marital status: Married    Spouse name: N/A  . Number of children: N/A  . Years of education: N/A   Social History Main Topics  . Smoking status: Current Every Day Smoker    Packs/day: 1.00    Years: 20.00    Types: Cigarettes  . Smokeless tobacco: Never Used     Comment: 1 pack every 3rd day  . Alcohol use 0.6 oz/week    1 Cans of beer per week     Comment: BAL not available at time of assessment  . Drug use: Yes    Types: Marijuana     Comment: Pt denied current use; UDS NA at time of assessment  . Sexual activity: Not Asked     Comment: no birth control/no protection   Other Topics Concern  . None   Social History Narrative  . None    Hospital Course:  Jesse Mayer was admitted for Bipolar disorder, curr episode mixed, severe, with psychotic features ([place]) ,  and crisis management.  Pt was treated discharged with the medications listed below under Medication List.  Medical problems were identified and treated as needed.  Home medications were restarted as appropriate.  Improvement was monitored by observation and Jesse Mayer 'Jesse daily report of symptom reduction.  Emotional and mental status  was monitored by daily self-inventory reports completed by Jesse Mayer and clinical staff.         Jesse Mayer was evaluated by the treatment team for stability and plans for continued recovery upon discharge. Jesse Mayer 'Jesse motivation was an integral factor for scheduling further treatment. Employment, transportation, bed availability, health status, family support, and any pending legal issues were also considered during hospital stay. Pt was offered further treatment options upon discharge including but not limited to Residential, Intensive Outpatient, and Outpatient treatment.  Jesse Mayer  Jesse Mayer will follow up with the services as listed below under Follow Up Information.     Upon completion of this admission the patient was both mentally and medically stable for discharge denying suicidal/homicidal ideation, auditory/visual/tactile hallucinations, delusional thoughts and paranoia.   Jesse Mayer responded well to treatment with Celexa 10 mg, Lamictal 25mg  and  Risperdal  2 mg without adverse effects. Pt demonstrated improvement without reported or observed adverse effects to the point of stability appropriate for outpatient management. Pertinent labs include: CMP, Low potassium 3.1. Lipids  for which outpatient follow-up is necessary for lab recheck as mentioned below. Reviewed CBC, CMP, BAL, and UDS; all unremarkable aside from noted exceptions.   Physical Findings: AIMS:  , ,  ,  ,    CIWA:  CIWA-Ar Total: 0 COWS:     Musculoskeletal: Strength & Muscle Tone: within normal limits Gait & Station: normal Patient leans: N/A  Psychiatric Specialty Exam: See SRA by MD Physical Exam  Nursing note and vitals reviewed. Constitutional: He is oriented to person, place, and time.  Neurological: He is alert and oriented to person, place, and time.  Psychiatric: He has a normal mood and affect.    Review of Systems  Psychiatric/Behavioral: Negative for depression  (stable) and suicidal ideas. The patient is not nervous/anxious (stable).     Blood pressure 140/80, pulse 76, temperature 97.6 F (36.4 C), temperature source Oral, resp. rate 20, height 6\' 3"  (1.905 m), weight (!) 179.2 kg (395 lb).Body mass index is 49.37 kg/m.   Have you used any form of tobacco in the last 30 days? (Cigarettes, Smokeless Tobacco, Cigars, and/or Pipes): Yes  Has this patient used any form of tobacco in the last 30 days? (Cigarettes, Smokeless Tobacco, Cigars, and/or Pipes) Yes, Yes, A prescription for an FDA-approved tobacco cessation medication was offered at discharge and the patient refused  Blood Alcohol level:  Lab Results  Component Value Date   [facility] <5 09/02/2016   ETH <11 99/24/2683    Metabolic Disorder Labs:  Lab Results  Component Value Date   HGBA1C 5.9 (H) 04/04/2016   MPG 126 10/05/2015   MPG 128 (H) 10/12/2014   No results found for: PROLACTIN Lab Results  Component Value Date   CHOL 201 (H) 10/05/2015   TRIG 148 10/05/2015   HDL 46 10/05/2015   CHOLHDL 4.4 10/05/2015   VLDL 30 10/05/2015   LDLCALC 125 10/05/2015   LDLCALC 144 (H) 10/12/2014    See Psychiatric Specialty Exam and Suicide Risk Assessment completed by Attending Physician prior to discharge.  Discharge destination:  Home  Is patient on multiple antipsychotic therapies at discharge:  No   Has Patient had three or more failed trials of antipsychotic monotherapy by history:  No  Recommended Plan for Multiple Antipsychotic Therapies: NA  Discharge Instructions    Diet - low sodium heart healthy    Complete by:  As directed    Discharge instructions    Complete by:  As directed    Take all medications as prescribed. Keep all follow-up appointments as scheduled.  Do not consume alcohol or use illegal drugs while on prescription medications. Report any adverse effects from your medications to your primary care provider promptly.  In the event of recurrent symptoms or  worsening symptoms, call 911, a crisis hotline, or go to the nearest emergency department for evaluation.   Increase activity slowly    Complete by:  As directed      Allergies as of  09/07/2016   No Known Allergies     Medication List    STOP taking these medications   cyclobenzaprine 10 MG tablet Commonly known as:  FLEXERIL   risperiDONE 2 MG tablet Commonly known as:  RISPERDAL   temazepam 15 MG capsule Commonly known as:  RESTORIL     TAKE these medications     Indication  citalopram 10 MG tablet Commonly known as:  CELEXA Take 1 tablet (10 mg total) by mouth daily.  Indication:  Depression   hydrOXYzine 25 MG tablet Commonly known as:  ATARAX/VISTARIL Take 1 tablet (25 mg total) by mouth 3 (three) times daily as needed for anxiety.  Indication:  Anxiety Neurosis   lamoTRIgine 25 MG tablet Commonly known as:  LAMICTAL Take 1 tablet (25 mg total) by mouth daily. Start taking on:  09/08/2016  Indication:  Depression   lisinopril 10 MG tablet Commonly known as:  PRINIVIL,ZESTRIL Take 1 tablet (10 mg total) by mouth daily.  Indication:  High Blood Pressure Disorder   nicotine 21 mg/24hr patch Commonly known as:  NICODERM CQ - dosed in mg/24 hours Place 1 patch (21 mg total) onto the skin daily. Start taking on:  09/08/2016  Indication:  Nicotine Addiction   zolpidem 5 MG tablet Commonly known as:  AMBIEN Take 1 tablet (5 mg total) by mouth at bedtime as needed for sleep.  Indication:  [place] [place] [facility] up on 10/09/2016.   Why:  Tuesday at 10:20 with Dr Vena Rua. Bring along ID, insurance card, hospital d/c paperwork. I asked that you be put on a cancellation list in case there is a sooner appointment that becomes available. Contact information: [place] [address] [phone]           Follow-up recommendations:  Activity:  as tolerated  Diet:  heart  healthy  Comments:  Take all medications as prescribed. Keep all follow-up appointments as scheduled.  Do not consume alcohol or use illegal drugs while on prescription medications. Report any adverse effects from your medications to your primary care provider promptly.  In the event of recurrent symptoms or worsening symptoms, call 911, a crisis hotline, or go to the nearest emergency department for evaluation.   Signed: Derrill Center, NP 09/07/2016, 9:53 AM   Patient seen, chart reviewed and case discussed with the physician extender and develop safe disposition plan. Completed and documented discharge suicide risk assessment. Reviewed the information documented and agree with the treatment plan.  Chaselyn Nanney 09/08/2016 2:12 PM

## 2016-09-07 NOTE — BHH Suicide Risk Assessment (Signed)
[facility] Discharge Suicide Risk Assessment   Principal Problem: Bipolar disorder, curr episode mixed, severe, with psychotic features [facility]) Discharge Diagnoses:  Patient Active Problem List   Diagnosis Date Noted  . Alcohol use disorder, severe, dependence ([place]) [F10.20] 09/05/2016  . Bipolar disorder, curr episode mixed, severe, with psychotic features ([place]) [F31.64] 09/05/2016  . Chronic insomnia [F51.04] 08/24/2015  . Borderline diabetes [R73.03] 10/19/2014  . Preoperative examination [Z01.818] 08/03/2013  . Incomplete right bundle branch block [I45.10] 08/03/2013  . Morbid obesity ([place]) [E66.01] 05/10/2013  . Unspecified sleep apnea [G47.30] 11/11/2012  . Routine general medical examination at a health care facility [Z00.00] 11/11/2012  . Chronic diarrhea [K52.9] 11/11/2012  . Rectal bleeding [K62.5] 11/11/2012  . Boil of buttock [L02.32] 11/11/2012  . Taking multiple medications for chronic disease [R69] 05/06/2012  . HTN (hypertension) [I10] 08/14/2011  . Mild hyperlipidemia [E78.5] 08/14/2011  . Tobacco user [Z72.0] 08/14/2011  . Generalized anxiety disorder [F41.1] 07/02/2011    Total Time spent with patient: 30 minutes  Musculoskeletal: Strength & Muscle Tone: within normal limits Gait & Station: normal Patient leans: N/A  Psychiatric Specialty Exam: ROS  Blood pressure 140/80, pulse 76, temperature 97.6 F (36.4 C), temperature source Oral, resp. rate 20, height 6\' 3"  (1.905 m), weight (!) 179.2 kg (395 lb).Body mass index is 49.37 kg/m.  General Appearance: Casual  Eye Contact::  Good  Speech:  Clear and [id number]  Volume:  Normal  Mood:  Euthymic  Affect:  Appropriate and Congruent  Thought Process:  Coherent and Goal Directed  Orientation:  Full (Time, Place, and Person)  Thought Content:  Logical  Suicidal Thoughts:  No  Homicidal Thoughts:  No  Memory:  Immediate;   Good Recent;   Fair Remote;   Fair  Judgement:  Intact  Insight:  Fair  Psychomotor  Activity:  Normal  Concentration:  Good  Recall:  Good  Fund of Knowledge:Good  Language: Good  Akathisia:  Negative  Handed:  Right  AIMS (if indicated):     Assets:  Communication Skills Desire for Improvement Financial Resources/Insurance Housing Intimacy Leisure Time Physical Health Resilience Social Support Talents/Skills Transportation Vocational/Educational  Sleep:  Number of Hours: 4.5  Cognition: WNL  ADL's:  Intact   Mental Status Per Nursing Assessment::   On Admission:  NA  Demographic Factors:  Male and Adolescent or young adult  Loss Factors: NA  Historical Factors: Impulsivity  Risk Reduction Factors:   Responsible for children under 36 years of age, Sense of responsibility to family, Religious beliefs about death, Employed, Living with another person, especially a relative, Positive social support, Positive therapeutic relationship and Positive coping skills or problem solving skills  Continued Clinical Symptoms:  Bipolar Disorder:   Mixed State Alcohol/Substance Abuse/Dependencies Previous Psychiatric Diagnoses and Treatments  Cognitive Features That Contribute To Risk:  Polarized thinking    Suicide Risk:  Minimal: No identifiable suicidal ideation.  Patients presenting with no risk factors but with morbid ruminations; may be classified as minimal risk based on the severity of the depressive symptoms  Follow-up Information    [facility] [place] [facility] up on 10/09/2016.   Why:  Tuesday at 10:20 with Dr Vena Rua. Bring along ID, insurance card, hospital d/c paperwork. I asked that you be put on a cancellation list in case there is a sooner appointment that becomes available. Contact information: [place] [address] [phone]           Plan Of Care/Follow-up recommendations:  Activity:  as  tolerated Diet:  regular  Ambrose Finland, MD 09/07/2016, 10:50 AM   Discussed with Dr. Louretta Shorten- he  processed discharge and patient is ready for discharge. As not currently on unit, has asked me to cosign in order to finalize.

## 2016-09-09 ENCOUNTER — Emergency Department
Admission: EM | Admit: 2016-09-09 | Discharge: 2016-09-10 | Disposition: A | Discharge status: Home / Self Care | Payer: 59 | Attending: Emergency Medicine | Admitting: Emergency Medicine

## 2016-09-09 ENCOUNTER — Encounter: Payer: Self-pay | Admitting: Emergency Medicine

## 2016-09-09 DIAGNOSIS — F29 Unspecified psychosis not due to a substance or known physiological condition: Secondary | ICD-10-CM | POA: Diagnosis not present

## 2016-09-09 DIAGNOSIS — Z79899 Other long term (current) drug therapy: Secondary | ICD-10-CM | POA: Insufficient documentation

## 2016-09-09 DIAGNOSIS — F3164 Bipolar disorder, current episode mixed, severe, with psychotic features: Secondary | ICD-10-CM | POA: Diagnosis present

## 2016-09-09 DIAGNOSIS — F17210 Nicotine dependence, cigarettes, uncomplicated: Secondary | ICD-10-CM | POA: Diagnosis not present

## 2016-09-09 DIAGNOSIS — I10 Essential (primary) hypertension: Secondary | ICD-10-CM | POA: Insufficient documentation

## 2016-09-09 DIAGNOSIS — R45850 Homicidal ideations: Secondary | ICD-10-CM

## 2016-09-09 LAB — CBC WITH DIFFERENTIAL/PLATELET
Basophils Absolute: 0.1 10*3/uL (ref 0–0.1)
Basophils Relative: 1 %
EOS ABS: 0.3 10*3/uL (ref 0–0.7)
Eosinophils Relative: 4 %
HCT: 48.9 % (ref 40.0–52.0)
HEMOGLOBIN: 16.1 g/dL (ref 13.0–18.0)
LYMPHS ABS: 1.7 10*3/uL (ref 1.0–3.6)
Lymphocytes Relative: 26 %
MCH: 27.4 pg (ref 26.0–34.0)
MCHC: 32.9 g/dL (ref 32.0–36.0)
MCV: 83.2 fL (ref 80.0–100.0)
MONOS PCT: 8 %
Monocytes Absolute: 0.5 10*3/uL (ref 0.2–1.0)
NEUTROS PCT: 61 %
Neutro Abs: 4 10*3/uL (ref 1.4–6.5)
Platelets: 451 10*3/uL — ABNORMAL HIGH (ref 150–440)
RBC: 5.87 MIL/uL (ref 4.40–5.90)
RDW: 14.7 % — ABNORMAL HIGH (ref 11.5–14.5)
WBC: 6.5 10*3/uL (ref 3.8–10.6)

## 2016-09-09 LAB — COMPREHENSIVE METABOLIC PANEL
ALK PHOS: 59 U/L (ref 38–126)
ALT: 43 U/L (ref 17–63)
ANION GAP: 6 (ref 5–15)
AST: 32 U/L (ref 15–41)
Albumin: 4.5 g/dL (ref 3.5–5.0)
BILIRUBIN TOTAL: 0.6 mg/dL (ref 0.3–1.2)
BUN: 12 mg/dL (ref 6–20)
CALCIUM: 9.3 mg/dL (ref 8.9–10.3)
CO2: 27 mmol/L (ref 22–32)
Chloride: 102 mmol/L (ref 101–111)
Creatinine, Ser: 1.27 mg/dL — ABNORMAL HIGH (ref 0.61–1.24)
GFR calc non Af Amer: >60 mL/min (ref >60)
Glucose, Bld: 112 mg/dL — ABNORMAL HIGH (ref 65–99)
Potassium: 4.1 mmol/L (ref 3.5–5.1)
SODIUM: 135 mmol/L (ref 135–145)
TOTAL PROTEIN: 8.3 g/dL — AB (ref 6.5–8.1)

## 2016-09-09 LAB — ACETAMINOPHEN LEVEL

## 2016-09-09 LAB — SALICYLATE LEVEL

## 2016-09-09 LAB — ETHANOL

## 2016-09-09 MED ORDER — BENZTROPINE MESYLATE 1 MG PO TABS
1.0000 mg | ORAL_TABLET | Freq: Two times a day (BID) | ORAL | Status: DC
  Administered 2016-09-10: 1 mg via ORAL
  Filled 2016-09-09: qty 1

## 2016-09-09 MED ORDER — NICOTINE 21 MG/24HR TD PT24
21.0000 mg | MEDICATED_PATCH | Freq: Every day | TRANSDERMAL | Status: DC
  Filled 2016-09-09: qty 1

## 2016-09-09 MED ORDER — ZOLPIDEM TARTRATE 5 MG PO TABS
ORAL_TABLET | ORAL | Status: AC
  Administered 2016-09-09: 5 mg
  Filled 2016-09-09: qty 1

## 2016-09-09 MED ORDER — BENZTROPINE MESYLATE 1 MG/ML IJ SOLN: 1.0000 mg | Freq: Two times a day (BID) | INTRAMUSCULAR | Status: DC

## 2016-09-09 MED ORDER — HALOPERIDOL LACTATE 5 MG/ML IJ SOLN: 5.0000 mg | INTRAMUSCULAR | Status: DC | PRN

## 2016-09-09 MED ORDER — LORAZEPAM 2 MG/ML IJ SOLN: 2.0000 mg | INTRAMUSCULAR | Status: DC | PRN

## 2016-09-09 MED ORDER — LAMOTRIGINE 25 MG PO TABS
25.0000 mg | ORAL_TABLET | Freq: Every day | ORAL | Status: DC
  Administered 2016-09-10: 25 mg via ORAL
  Filled 2016-09-09: qty 1

## 2016-09-09 MED ORDER — RISPERIDONE 1 MG PO TABS: 2.0000 mg | ORAL_TABLET | Freq: Two times a day (BID) | ORAL | Status: DC

## 2016-09-09 MED ORDER — HYDROXYZINE HCL 25 MG PO TABS
25.0000 mg | ORAL_TABLET | Freq: Three times a day (TID) | ORAL | Status: DC | PRN
  Administered 2016-09-10: 25 mg via ORAL

## 2016-09-09 MED ORDER — NICOTINE 21 MG/24HR TD PT24
21.0000 mg | MEDICATED_PATCH | Freq: Every day | TRANSDERMAL | Status: DC
  Administered 2016-09-10: 21 mg via TRANSDERMAL

## 2016-09-09 MED ORDER — LISINOPRIL 5 MG PO TABS
10.0000 mg | ORAL_TABLET | Freq: Every day | ORAL | Status: DC
  Administered 2016-09-10: 10 mg via ORAL
  Filled 2016-09-09: qty 2

## 2016-09-09 MED ORDER — CITALOPRAM HYDROBROMIDE 20 MG PO TABS
10.0000 mg | ORAL_TABLET | Freq: Every day | ORAL | Status: DC
Start: 2016-09-10 — End: 2016-09-10
  Administered 2016-09-10: 20 mg via ORAL
  Filled 2016-09-09: qty 1

## 2016-09-09 MED ORDER — ZOLPIDEM TARTRATE 5 MG PO TABS: 5.0000 mg | ORAL_TABLET | Freq: Every evening | ORAL | Status: DC | PRN

## 2016-09-09 MED ORDER — HYDROXYZINE HCL 25 MG PO TABS
25.0000 mg | ORAL_TABLET | Freq: Three times a day (TID) | ORAL | Status: DC | PRN
  Filled 2016-09-09: qty 1

## 2016-09-09 MED ORDER — DIPHENHYDRAMINE HCL 50 MG/ML IJ SOLN
50.0000 mg | INTRAMUSCULAR | Status: DC | PRN
Start: 2016-09-09 — End: 2016-09-10

## 2016-09-09 NOTE — ED Notes (Signed)
Per lea furguson, rn, pt's spouse would like a phone call if pt is to be discharged. Her number is [phone] or [phone] [facility]).

## 2016-09-09 NOTE — ED Notes (Signed)
Per dr. Wendi Snipes, [facility] psychiatrist made statements threatening the life of two co-workers at [employer] in [place], [place]. Co-workers names are Antonio and [employer]. Charlena Cross with [employer] notified to contact appropriate authorities to warn pt's co-workers [employer] and [employer] of threats.

## 2016-09-09 NOTE — ED Notes (Signed)
Officer Cox with [employer] police department called to speak with this RN regarding information relayed to her by [employer]. Names of co-workers and place of employment provided to officer Cox with information that pt stated to dr. Wendi Snipes he was going to kill his co-workers.

## 2016-09-09 NOTE — ED Triage Notes (Addendum)
Pt to ED by [facility] due to wife stating that the Pt became aggressive and making threats to her and their daughter. PD states that the wife told them that the pt was telling her and their daughter that they were "full of demons and he was going to release them". Upon arrival to the ED one of the officers told this RN that the pt was aggressive with them and had rushed him.  Pt upon arrival denies HI. Pt states that he "never spoke of demons" and "never touched his wife or daughter". He states he had asked them to clean his daughters room and that he did become angry but made no threats or touched them. Pt recently released from [facility].

## 2016-09-09 NOTE — ED Provider Notes (Signed)
[facility] Emergency Department Provider Note  ____________________________________________   First MD Initiated Contact with Patient 09/09/16 1803     (approximate)  I have reviewed the triage vital signs and the nursing notes.   HISTORY  Chief Complaint No chief complaint on file.   HPI Jesse Mayer is a 46 y.o. male with a history of anxiety and depression as well as substance abuse who is presenting to the emergency department today with psychotic behavior prior to arrival. Per EMS as well as Police Department the patient was seeing demons and then threatened to [employer] on his family. His wife is now in the emergency department is saying that he thought that the rest of his family were demons and threatened to kill them. The patient recently was admitted to behavioral health in [place]. Despite the prehospital reports, the patient is calm at this time. Denies any hallucinations. Denies any suicidal or homicidal ideation.   Past Medical History:  Diagnosis Date  . Anxiety   . Depression   . Hypertension   . Substance abuse    in remission    Patient Active Problem List   Diagnosis Date Noted  . Alcohol use disorder, severe, dependence ([place]) 09/05/2016  . Bipolar disorder, curr episode mixed, severe, with psychotic features ([place]) 09/05/2016  . Chronic insomnia 08/24/2015  . Borderline diabetes 10/19/2014  . Preoperative examination 08/03/2013  . Incomplete right bundle branch block 08/03/2013  . Morbid obesity ([place]) 05/10/2013  . Unspecified sleep apnea 11/11/2012  . Routine general medical examination at a health care facility 11/11/2012  . Chronic diarrhea 11/11/2012  . Rectal bleeding 11/11/2012  . Boil of buttock 11/11/2012  . Taking multiple medications for chronic disease 05/06/2012  . HTN (hypertension) 08/14/2011  . Mild hyperlipidemia 08/14/2011  . Tobacco user 08/14/2011  . Generalized anxiety disorder 07/02/2011     Past Surgical History:  Procedure Laterality Date  . COLONOSCOPY N/A 01/01/2013   Procedure: COLONOSCOPY;  Surgeon: Rogene Houston, MD;  [facility];  Service: Endoscopy;  Laterality: N/A;  830  . FOOT SURGERY    . htn    . TONSILLECTOMY     childhood    Prior to Admission medications   Medication Sig Start Date End Date Taking? Authorizing Provider  citalopram (CELEXA) 10 MG tablet Take 1 tablet (10 mg total) by mouth daily. 09/26/15   Alycia Rossetti, MD  hydrOXYzine (ATARAX/VISTARIL) 25 MG tablet Take 1 tablet (25 mg total) by mouth 3 (three) times daily as needed for anxiety. 09/07/16   Derrill Center, NP  lamoTRIgine (LAMICTAL) 25 MG tablet Take 1 tablet (25 mg total) by mouth daily. 09/08/16   Derrill Center, NP  lisinopril (PRINIVIL,ZESTRIL) 10 MG tablet Take 1 tablet (10 mg total) by mouth daily. Patient not taking: Reported on 09/02/2016 04/04/16   Alycia Rossetti, MD  nicotine (NICODERM CQ - DOSED IN MG/24 HOURS) 21 mg/24hr patch Place 1 patch (21 mg total) onto the skin daily. 09/08/16   Derrill Center, NP  zolpidem (AMBIEN) 5 MG tablet Take 1 tablet (5 mg total) by mouth at bedtime as needed for sleep. 09/07/16   Derrill Center, NP    Allergies Patient has no known allergies.  Family History  Problem Relation Age of Onset  . Depression Maternal Grandfather   . Dementia Maternal Grandfather        and MGGM, his mother  . OCD Mother   .  Alcohol abuse Maternal Uncle   . Drug abuse Maternal Uncle   . Cancer Paternal Grandmother        colon cancer  . Colon cancer Paternal Grandmother   . ADD / ADHD Neg Hx   . Anxiety disorder Neg Hx   . Bipolar disorder Neg Hx   . Paranoid behavior Neg Hx   . Schizophrenia Neg Hx   . Seizures Neg Hx   . Sexual abuse Neg Hx   . Physical abuse Neg Hx     Social History Social History  Substance Use Topics  . Smoking status: Current Every Day Smoker    Packs/day: 1.00    Years: 20.00    Types: Cigarettes  .  Smokeless tobacco: Never Used     Comment: 1 pack every 3rd day  . Alcohol use 0.6 oz/week    1 Cans of beer per week     Comment: BAL not available at time of assessment    Review of Systems  Constitutional: No fever/chills Eyes: No visual changes. ENT: No sore throat. Cardiovascular: Denies chest pain. Respiratory: Denies shortness of breath. Gastrointestinal: No abdominal pain.  No nausea, no vomiting.  No diarrhea.  No constipation. Genitourinary: Negative for dysuria. Musculoskeletal: Negative for back pain. Skin: Negative for rash. Neurological: Negative for headaches, focal weakness or numbness.   ____________________________________________   PHYSICAL EXAM:  VITAL SIGNS: ED Triage Vitals  Enc Vitals Group     BP 09/09/16 1806 (!) 143/92     Pulse Rate 09/09/16 1806 (!) 113     Resp --      Temp 09/09/16 1806 98.7 F (37.1 C)     Temp Source 09/09/16 1806 Oral     SpO2 09/09/16 1806 95 %     Weight --      Height --      Head Circumference --      Peak Flow --      Pain Score 09/09/16 1809 0     Pain Loc --      Pain Edu? --      Excl. in [place]? --     Constitutional: Alert and oriented. Well appearing and in no acute distress. Eyes: Conjunctivae are normal.  Head: Atraumatic. Nose: No congestion/rhinnorhea. Mouth/Throat: Mucous membranes are moist.  Neck: No stridor.   Cardiovascular: Normal rate, regular rhythm. Grossly normal heart sounds.  Respiratory: Normal respiratory effort.  No retractions. Lungs CTAB. Gastrointestinal: Soft and nontender. No distention. No CVA tenderness. Musculoskeletal: No lower extremity tenderness nor edema.  No joint effusions. Neurologic:  Normal speech and language. No gross focal neurologic deficits are appreciated. Skin:  Skin is warm, dry and intact. No rash noted. Psychiatric: Mood and affect are normal. Speech and behavior are normal.  ____________________________________________   LABS (all labs ordered are  listed, but only abnormal results are displayed)  Labs Reviewed - No data to display ____________________________________________  EKG   ____________________________________________  RADIOLOGY   ____________________________________________   PROCEDURES  Procedure(s) performed:   Procedures  Critical Care performed:   ____________________________________________   INITIAL IMPRESSION / ASSESSMENT AND PLAN / ED COURSE  Pertinent labs & imaging results that were available during my care of the patient were reviewed by me and considered in my medical decision making (see chart for details).  [name] involuntary commitment was upheld. Patient to be seen by psychiatry in consult.      ____________________________________________   FINAL CLINICAL IMPRESSION(S) / ED DIAGNOSES  Psychosis. Homicidal ideation.  NEW MEDICATIONS STARTED DURING THIS VISIT:  New Prescriptions   No medications on file     Note:  This document was prepared using Dragon voice recognition software and may include unintentional dictation errors.     Orbie Pyo, MD 09/09/16 [phone]

## 2016-09-09 NOTE — ED Notes (Signed)
Report from kim, rn.  

## 2016-09-09 NOTE — ED Notes (Signed)
Report to margaret, rn.

## 2016-09-09 NOTE — BH Assessment (Signed)
Assessment Note  Jesse Mayer is an 46 y.o. male. Jesse Mayer arrived to the ED today by way of EMS. He reports that he told his wife and daughter to clean the house, "Me and my wife have not been the best example". He reports that he has recently become more active in the church and that he knows that "We should be good stewards of the things we have". He reports that the family did clean the home.  At some point someone called the police. Reports he was at [facility] for 3 days and was discharged this past Friday. He states that it was reported that he saw a demon, he denied that he stated this.  He denied symptoms of depression or anxiety.  He denied having auditory or visual hallucinations.  He denied having suicidal or homicidal thoughts or intent.  He denied any new stressors.  He denied the use of alcohol or drugs.  IVC paperwork reports by [facility] sheriff's department "Respondent has history of Mental Illness and was released 09-07-2016 for mental illness issues. Defendant is extremely delusional with visual hallucinations.  He is threatening to kill family members (Wife and daughter). TTS spoke with Vania Rea (   ) She reports that he has been down today.  Family went to church and out to dinner. On the way to her mother's home, he started to become agitated. On the way home he stated that the daughter needed to clean her room "You have been home less than a day and it is a mess".  He was making comments about her music and television shows, and what he did or did not approve.  He instructed his wife to clean the other daughter's room "I'm giving you 10 minutes".  He started yelling about respect, and he got in my face and was yelling at the family pets and the daughter.  He then started saying "Our demons need to be released, he started talking to the dog and it needed the demons released, the family needed to go to the alter next Sunday, the only way to release the demons is to  die." He was talking to himself.  He grabbed his car keys and was shaking them in their faces saying "I see you".  She states he also threatened the dog.  She states that she contacted 911.  Diagnosis: Bipolar Disorder, Hallucinations  Past Medical History:  Past Medical History:  Diagnosis Date  . Anxiety   . Depression   . Hypertension   . Substance abuse    in remission    Past Surgical History:  Procedure Laterality Date  . COLONOSCOPY N/A 01/01/2013   Procedure: COLONOSCOPY;  Surgeon: Rogene Houston, MD;  [facility];  Service: Endoscopy;  Laterality: N/A;  830  . FOOT SURGERY    . htn    . TONSILLECTOMY     childhood    Family History:  Family History  Problem Relation Age of Onset  . Depression Maternal Grandfather   . Dementia Maternal Grandfather        and MGGM, his mother  . OCD Mother   . Alcohol abuse Maternal Uncle   . Drug abuse Maternal Uncle   . Cancer Paternal Grandmother        colon cancer  . Colon cancer Paternal Grandmother   . ADD / ADHD Neg Hx   . Anxiety disorder Neg Hx   . Bipolar disorder Neg Hx   . Paranoid  behavior Neg Hx   . Schizophrenia Neg Hx   . Seizures Neg Hx   . Sexual abuse Neg Hx   . Physical abuse Neg Hx     Social History:  reports that he has been smoking Cigarettes.  He has a 20.00 pack-year smoking history. He has never used smokeless tobacco. He reports that he drinks about 0.6 oz of alcohol per week . He reports that he uses drugs, including Marijuana.  Additional Social History:  Alcohol / Drug Use History of alcohol / drug use?: No history of alcohol / drug abuse  CIWA: CIWA-Ar BP: (!) 143/92 Pulse Rate: (!) 113 COWS:    Allergies: No Known Allergies  Home Medications:  (Not in a hospital admission)  OB/GYN Status:  No LMP for male patient.  General Assessment Data Location of Assessment: [facility] ED TTS Assessment: In system Is this a Tele or Face-to-Face Assessment?: Face-to-Face Is this an  Initial Assessment or a Re-assessment for this encounter?: Initial Assessment Marital status: Married [place] name: n/a Is patient pregnant?: No Pregnancy Status: No Living Arrangements: Spouse/significant other, Children Can pt return to current living arrangement?: Yes Admission Status: Involuntary Is patient capable of signing voluntary admission?: Yes Referral Source: Self/Family/Friend Insurance type: [place] Screening Exam ([place]) Medical Exam completed: Yes  Crisis Care Plan Living Arrangements: Spouse/significant other, Children Legal Guardian: Other: (Self) Name of Psychiatrist: None as yet, 1st appointment is July 28 Name of Therapist: None as yet, 1st appointment scheduled  Education Status Is patient currently in school?: No Current Grade: n/a Highest grade of school patient has completed: [employer] Name of school: [employer] person: n/a  Risk to self with the past 6 months Suicidal Ideation: No Has patient been a risk to self within the past 6 months prior to admission? : No Suicidal Intent: No Has patient had any suicidal intent within the past 6 months prior to admission? : No Is patient at risk for suicide?: No Suicidal Plan?: No Has patient had any suicidal plan within the past 6 months prior to admission? : No Access to Means: No What has been your use of drugs/alcohol within the last 12 months?: Denied Previous Attempts/Gestures: Yes How many times?: 1 Other Self Harm Risks: denied Triggers for Past Attempts: Hallucinations Intentional Self Injurious Behavior: None Family Suicide History: Yes (Paternal grandfather) Recent stressful life event(s):  (Denied) Persecutory voices/beliefs?: No Depression: No Depression Symptoms:  (Denied by patient) Substance abuse history and/or treatment for substance abuse?: No Suicide prevention information given to non-admitted patients: Not applicable  Risk to Others within the  past 6 months Homicidal Ideation: No Does patient have any lifetime risk of violence toward others beyond the six months prior to admission? : No Thoughts of Harm to Others: No Current Homicidal Intent: No Current Homicidal Plan: No Access to Homicidal Means: No Identified Victim: None identified History of harm to others?: No Assessment of Violence: None Noted Does patient have access to weapons?: No Criminal Charges Pending?: No Does patient have a court date: No Is patient on probation?: No  Psychosis Hallucinations: None noted Delusions: None noted  Mental Status Report Appearance/Hygiene: In scrubs Eye Contact: Good Motor Activity: Unremarkable Speech: Logical/coherent Level of Consciousness: Alert Mood: Euthymic Affect: Appropriate to circumstance Anxiety Level: None Thought Processes: Coherent Judgement: Unimpaired Orientation: Person, Place, Situation, Time Obsessive Compulsive Thoughts/Behaviors: None  Cognitive Functioning Concentration: Normal Memory: Recent Intact IQ: Average Insight: Fair Impulse Control: Fair Appetite: Good Sleep: No  Change  ADLScreening [facility] Assessment Services) [name] cognitive ability adequate to safely complete daily activities?: Yes Patient able to express need for assistance with ADLs?: Yes Independently performs ADLs?: Yes (appropriate for developmental age)  Prior Inpatient Therapy Prior Inpatient Therapy: Yes Prior Therapy Dates: June 2018 & 2013 Prior Therapy Facilty/Provider(s): [facility] Reason for Treatment: Bipolar Disorder  Prior Outpatient Therapy Prior Outpatient Therapy: Yes Prior Therapy Dates: 2014 Prior Therapy Facilty/Provider(s): [facility] Reason for Treatment: Bipolar Disorder Does patient have an ACCT team?: No Does patient have Intensive In-House Services?  : No Does patient have [facility] services? : No Does patient have P4CC services?: No  ADL Screening (condition at time of  admission) [name] cognitive ability adequate to safely complete daily activities?: Yes Is the patient deaf or have difficulty hearing?: No Does the patient have difficulty seeing, even when wearing glasses/contacts?: No Does the patient have difficulty concentrating, remembering, or making decisions?: No Patient able to express need for assistance with ADLs?: Yes Does the patient have difficulty dressing or bathing?: No Independently performs ADLs?: Yes (appropriate for developmental age) Does the patient have difficulty walking or climbing stairs?: No Weakness of Legs: None Weakness of Arms/Hands: None  Home Assistive Devices/Equipment Home Assistive Devices/Equipment: None    Abuse/Neglect Assessment (Assessment to be complete while patient is alone) Physical Abuse: Denies Verbal Abuse: Denies Sexual Abuse: Denies Exploitation of patient/patient's resources: Denies Self-Neglect: Denies     Regulatory affairs officer (For Healthcare) Does Patient Have a Medical Advance Directive?: No    Additional Information 1:1 In Past 12 Months?: No CIRT Risk: No Elopement Risk: No Does patient have medical clearance?: Yes     Disposition:  Disposition Initial Assessment Completed for this Encounter: Yes  On Site Evaluation by:   Reviewed with Physician:    Elmer Bales 09/09/2016 8:46 PM

## 2016-09-10 DIAGNOSIS — F3164 Bipolar disorder, current episode mixed, severe, with psychotic features: Secondary | ICD-10-CM

## 2016-09-10 MED ORDER — RISPERIDONE 2 MG PO TABS: 2.0000 mg | ORAL_TABLET | Freq: Two times a day (BID) | ORAL | 1 refills | Status: DC

## 2016-09-10 NOTE — ED Notes (Signed)
TTS spoke with Mechele Claude from [facility], There are no appropriate beds available at this time.

## 2016-09-10 NOTE — ED Notes (Signed)
ED BHU [place] Is the patient under IVC or is there intent for IVC: Yes.   Is the patient medically cleared: Yes.   Is there vacancy in the ED BHU: Yes.   Is the population mix appropriate for patient: Yes.   Is the patient awaiting placement in inpatient or outpatient setting: Yes.   Has the patient had a psychiatric consult: Yes.   Survey of unit performed for contraband, proper placement and condition of furniture, tampering with fixtures in bathroom, shower, and each patient room: Yes.   APPEARANCE/BEHAVIOR uncooperative NEURO ASSESSMENT Orientation: place and person Hallucinations: Yes.  Auditory Hallucinations and Visual Hallucinations Speech: Pressured Gait: normal RESPIRATORY ASSESSMENT Normal expansion.  Clear to auscultation.  No rales, rhonchi, or wheezing. CARDIOVASCULAR ASSESSMENT regular rate and rhythm, S1, S2 normal, no murmur, click, rub or gallop GASTROINTESTINAL ASSESSMENT soft, nontender, BS WNL, no r/g EXTREMITIES normal strength, tone, and muscle mass PLAN OF CARE Provide calm/safe environment. Vital signs assessed twice daily. ED BHU Assessment once each 12-hour shift. Collaborate with intake RN daily or as condition indicates. Assure the ED provider has rounded once each shift. Provide and encourage hygiene. Provide redirection as needed. Assess for escalating behavior; address immediately and inform ED provider.  Assess family dynamic and appropriateness for visitation as needed: Yes.   Educate the patient/family about BHU procedures/visitation: Yes.

## 2016-09-10 NOTE — ED Notes (Signed)
Report was received from April B., RN; Pt. 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 Monitoring.

## 2016-09-10 NOTE — ED Notes (Signed)
Referral information for Psychiatric Hospitalization faxed to;     [facility] [phone])   [facility] [phone] or [place] [phone]),    [facility] [phone]),    [place] [phone]),    [facility] [phone]),    [place] [phone])    [facility] [phone])

## 2016-09-10 NOTE — ED Notes (Signed)
Report was received from Jesse B., RN; Pt. Verbalizes  complaints of having a H/O Schizophrenia; with having auditory and visual hallucinations; and distress; denies S.I./Hi. Continue to monitor with 15 min. Monitoring.

## 2016-09-10 NOTE — ED Notes (Signed)
Medication education done.

## 2016-09-10 NOTE — Consult Note (Signed)
[facility] Consult   Reason for Consult:  Consult for 46 year old man with a history of bipolar or schizoaffective disorder who was just released from behavioral health Hospital Referring Physician:  Corky Downs Patient Identification: Jesse Mayer MRN:  [id number] Principal Diagnosis: Bipolar disorder, curr episode mixed, severe, with psychotic features [facility]) Diagnosis:   Patient Active Problem List   Diagnosis Date Noted  . Alcohol use disorder, severe, dependence ([place]) [F10.20] 09/05/2016  . Bipolar disorder, curr episode mixed, severe, with psychotic features ([place]) [F31.64] 09/05/2016  . Chronic insomnia [F51.04] 08/24/2015  . Borderline diabetes [R73.03] 10/19/2014  . Preoperative examination [Z01.818] 08/03/2013  . Incomplete right bundle branch block [I45.10] 08/03/2013  . Morbid obesity ([place]) [E66.01] 05/10/2013  . Unspecified sleep apnea [G47.30] 11/11/2012  . Routine general medical examination at a health care facility [Z00.00] 11/11/2012  . Chronic diarrhea [K52.9] 11/11/2012  . Rectal bleeding [K62.5] 11/11/2012  . Boil of buttock [L02.32] 11/11/2012  . Taking multiple medications for chronic disease [R69] 05/06/2012  . HTN (hypertension) [I10] 08/14/2011  . Mild hyperlipidemia [E78.5] 08/14/2011  . Tobacco user [Z72.0] 08/14/2011  . Generalized anxiety disorder [F41.1] 07/02/2011    Total Time spent with patient: 1 hour  Subjective:   Jesse Mayer is a 46 y.o. male patient admitted with "I just had a little problem".  HPI:  Patient interviewed chart reviewed. 46 year old man with a history of bipolar disorder was just discharged from behavioral health Hospital a couple days ago. Apparently he was discharged from the hospital without any risperidone or any other medicine for treatment of the mania brought him into the hospital. Reportedly he and his family went to church yesterday and on the way home he started to get in an argument with him. When  he got home he escalated and he is arguing with the family. Started making comments about having demons and devils inside him. Family got frightened. Patient has been given antipsychotics since being here in the emergency room and settled down quickly. On interview with me he didn't make any comments about devils demons or anything else inappropriate. Completely denied any thoughts of hurting himself or anyone else. Not abusing alcohol or any other drugs.  Social history: Patient lives with his wife and 56 year old daughter.  Medical history: Overweight history of hypertension.  Substance abuse history: Denies alcohol or drug abuse history  Past Psychiatric History: Patient has a history of bipolar disorder has had several assessments and evaluations in the past. Low doses of antipsychotic especially risperidone seemed to generally be well tolerated and cause a great deal of improvement. He was just at behavioral health Hospital this past week where he improved with Risperdal but for unclear reasons was discharged without any of that medicine. No history of suicide attempts does have a history of some agitation in the past.  Risk to Self: Suicidal Ideation: No Suicidal Intent: No Is patient at risk for suicide?: No Suicidal Plan?: No Access to Means: No What has been your use of drugs/alcohol within the last 12 months?: Denied How many times?: 1 Other Self Harm Risks: denied Triggers for Past Attempts: Hallucinations Intentional Self Injurious Behavior: None Risk to Others: Homicidal Ideation: No Thoughts of Harm to Others: No Current Homicidal Intent: No Current Homicidal Plan: No Access to Homicidal Means: No Identified Victim: None identified History of harm to others?: No Assessment of Violence: None Noted Does patient have access to weapons?: No Criminal Charges Pending?: No Does patient have a court date: No  Prior Inpatient Therapy: Prior Inpatient Therapy: Yes Prior Therapy  Dates: June 2018 & 2013 Prior Therapy Facilty/Provider(s): [facility] Reason for Treatment: Bipolar Disorder Prior Outpatient Therapy: Prior Outpatient Therapy: Yes Prior Therapy Dates: 2014 Prior Therapy Facilty/Provider(s): [facility] Reason for Treatment: Bipolar Disorder Does patient have an ACCT team?: No Does patient have Intensive In-House Services?  : No Does patient have [facility] services? : No Does patient have P4CC services?: No  Past Medical History:  Past Medical History:  Diagnosis Date  . Anxiety   . Depression   . Hypertension   . Substance abuse    in remission    Past Surgical History:  Procedure Laterality Date  . COLONOSCOPY N/A 01/01/2013   Procedure: COLONOSCOPY;  Surgeon: Rogene Houston, MD;  Location: [facility];  Service: Endoscopy;  Laterality: N/A;  830  . FOOT SURGERY    . htn    . TONSILLECTOMY     childhood   Family History:  Family History  Problem Relation Age of Onset  . Depression Maternal Grandfather   . Dementia Maternal Grandfather        and MGGM, his mother  . OCD Mother   . Alcohol abuse Maternal Uncle   . Drug abuse Maternal Uncle   . Cancer Paternal Grandmother        colon cancer  . Colon cancer Paternal Grandmother   . ADD / ADHD Neg Hx   . Anxiety disorder Neg Hx   . Bipolar disorder Neg Hx   . Paranoid behavior Neg Hx   . Schizophrenia Neg Hx   . Seizures Neg Hx   . Sexual abuse Neg Hx   . Physical abuse Neg Hx    Family Psychiatric  History: Does not know of any family history Social History:  History  Alcohol Use  . 0.6 oz/week  . 1 Cans of beer per week    Comment: BAL not available at time of assessment     History  Drug Use  . Types: Marijuana    Comment: Pt denied current use; UDS NA at time of assessment    Social History   Social History  . Marital status: Married    Spouse name: N/A  . Number of children: N/A  . Years of education: N/A   Social History Main Topics  . Smoking  status: Current Every Day Smoker    Packs/day: 1.00    Years: 20.00    Types: Cigarettes  . Smokeless tobacco: Never Used     Comment: 1 pack every 3rd day  . Alcohol use 0.6 oz/week    1 Cans of beer per week     Comment: BAL not available at time of assessment  . Drug use: Yes    Types: Marijuana     Comment: Pt denied current use; UDS NA at time of assessment  . Sexual activity: Not Asked     Comment: no birth control/no protection   Other Topics Concern  . None   Social History Narrative  . None   Additional Social History:    Allergies:  No Known Allergies  Labs:  Results for orders placed or performed during the hospital encounter of 09/09/16 (from the past 48 hour(s))  CBC with Differential     Status: Abnormal   Collection Time: 09/09/16  6:22 PM  Result Value Ref Range   WBC 6.5 3.8 - 10.6 K/uL   RBC 5.87 4.40 - 5.90 MIL/uL  Hemoglobin 16.1 13.0 - 18.0 g/dL   HCT 48.9 40.0 - 52.0 %   MCV 83.2 80.0 - 100.0 fL   MCH 27.4 26.0 - 34.0 pg   MCHC 32.9 32.0 - 36.0 g/dL   RDW 14.7 (H) 11.5 - 14.5 %   Platelets 451 (H) 150 - 440 K/uL   Neutrophils Relative % 61 %   Neutro Abs 4.0 1.4 - 6.5 K/uL   Lymphocytes Relative 26 %   Lymphs Abs 1.7 1.0 - 3.6 K/uL   Monocytes Relative 8 %   Monocytes Absolute 0.5 0.2 - 1.0 K/uL   Eosinophils Relative 4 %   Eosinophils Absolute 0.3 0 - 0.7 K/uL   Basophils Relative 1 %   Basophils Absolute 0.1 0 - 0.1 K/uL  Comprehensive metabolic panel     Status: Abnormal   Collection Time: 09/09/16  6:22 PM  Result Value Ref Range   Sodium 135 135 - 145 mmol/L   Potassium 4.1 3.5 - 5.1 mmol/L   Chloride 102 101 - 111 mmol/L   CO2 27 22 - 32 mmol/L   Glucose, Bld 112 (H) 65 - 99 mg/dL   BUN 12 6 - 20 mg/dL   Creatinine, Ser 1.27 (H) 0.61 - 1.24 mg/dL   Calcium 9.3 8.9 - 10.3 mg/dL   Total Protein 8.3 (H) 6.5 - 8.1 g/dL   Albumin 4.5 3.5 - 5.0 g/dL   AST 32 15 - 41 U/L   ALT 43 17 - 63 U/L   Alkaline Phosphatase 59 38 - 126 U/L    Total Bilirubin 0.6 0.3 - 1.2 mg/dL   GFR calc non Af Amer >60 >60 mL/min   GFR calc Af Amer >60 >60 mL/min    Comment: (NOTE) The eGFR has been calculated using the CKD EPI equation. This calculation has not been validated in all clinical situations. eGFR's persistently <60 mL/min signify possible Chronic Kidney Disease.    Anion gap 6 5 - 15  Ethanol     Status: None   Collection Time: 09/09/16  6:22 PM  Result Value Ref Range   Alcohol, Ethyl (B) <5 <5 mg/dL    Comment:        LOWEST DETECTABLE LIMIT FOR SERUM ALCOHOL IS 5 mg/dL FOR MEDICAL PURPOSES ONLY   Acetaminophen level     Status: Abnormal   Collection Time: 09/09/16  6:22 PM  Result Value Ref Range   Acetaminophen (Tylenol), Serum <10 (L) 10 - 30 ug/mL    Comment:        THERAPEUTIC CONCENTRATIONS VARY SIGNIFICANTLY. A RANGE OF 10-30 ug/mL MAY BE AN EFFECTIVE CONCENTRATION FOR MANY PATIENTS. HOWEVER, SOME ARE BEST TREATED AT CONCENTRATIONS OUTSIDE THIS RANGE. ACETAMINOPHEN CONCENTRATIONS >150 ug/mL AT 4 HOURS AFTER INGESTION AND >50 ug/mL AT 12 HOURS AFTER INGESTION ARE OFTEN ASSOCIATED WITH TOXIC REACTIONS.   Salicylate level     Status: None   Collection Time: 09/09/16  6:22 PM  Result Value Ref Range   Salicylate Lvl <1.6 2.8 - 30.0 mg/dL    No current facility-administered medications for this encounter.    Current Outpatient Prescriptions  Medication Sig Dispense Refill  . citalopram (CELEXA) 10 MG tablet Take 1 tablet (10 mg total) by mouth daily. 90 tablet 3  . hydrOXYzine (ATARAX/VISTARIL) 25 MG tablet Take 1 tablet (25 mg total) by mouth 3 (three) times daily as needed for anxiety. 30 tablet 0  . lamoTRIgine (LAMICTAL) 25 MG tablet Take 1 tablet (25 mg total) by mouth daily.  30 tablet 0  . nicotine (NICODERM CQ - DOSED IN MG/24 HOURS) 21 mg/24hr patch Place 1 patch (21 mg total) onto the skin daily. 28 patch 0  . zolpidem (AMBIEN) 5 MG tablet Take 1 tablet (5 mg total) by mouth at bedtime as  needed for sleep. 10 tablet 0  . lisinopril (PRINIVIL,ZESTRIL) 10 MG tablet Take 1 tablet (10 mg total) by mouth daily. (Patient not taking: Reported on 09/02/2016) 30 tablet 3  . risperiDONE (RISPERDAL) 2 MG tablet Take 1 tablet (2 mg total) by mouth 2 (two) times daily. 30 tablet 1    Musculoskeletal: Strength & Muscle Tone: within normal limits Gait & Station: normal Patient leans: N/A  Psychiatric Specialty Exam: Physical Exam  Nursing note and vitals reviewed. Constitutional: He appears well-developed and well-nourished.  HENT:  Head: Normocephalic and atraumatic.  Eyes: Conjunctivae are normal. Pupils are equal, round, and reactive to light.  Neck: Normal range of motion.  Cardiovascular: Regular rhythm and normal heart sounds.   Respiratory: Effort normal. No respiratory distress.  GI: Soft.  Musculoskeletal: Normal range of motion.  Neurological: He is alert.  Skin: Skin is warm and dry.  Psychiatric: His speech is normal and behavior is normal. His affect is blunt. Thought content is not paranoid. Cognition and memory are normal. He expresses impulsivity. He expresses no homicidal and no suicidal ideation.    Review of Systems  Constitutional: Negative.   HENT: Negative.   Eyes: Negative.   Respiratory: Negative.   Cardiovascular: Negative.   Gastrointestinal: Negative.   Musculoskeletal: Negative.   Skin: Negative.   Neurological: Negative.   Psychiatric/Behavioral: Negative for depression, hallucinations, memory loss, substance abuse and suicidal ideas. The patient is not nervous/anxious and does not have insomnia.     Blood pressure 139/78, pulse 90, temperature 98.1 F (36.7 C), temperature source Oral, resp. rate 20, SpO2 95 %.There is no height or weight on file to calculate BMI.  General Appearance: Casual  Eye Contact:  Good  Speech:  Slow  Volume:  Decreased  Mood:  Euthymic  Affect:  Constricted  Thought Process:  Goal Directed  Orientation:  Full  (Time, Place, and Person)  Thought Content:  Logical  Suicidal Thoughts:  No  Homicidal Thoughts:  No  Memory:  Immediate;   Good Recent;   Fair Remote;   Fair  Judgement:  Fair  Insight:  Fair  Psychomotor Activity:  Decreased  Concentration:  Concentration: Fair  Recall:  [employer] of Knowledge:  Fair  Language:  Fair  Akathisia:  No  Handed:  Right  AIMS (if indicated):     Assets:  Desire for Improvement Housing Resilience Social Support  ADL's:  Intact  Cognition:  WNL  Sleep:        Treatment Plan Summary: Medication management and Plan 46 year old man who shows a good response to antipsychotic medicine appears to have returned back to his lucid baseline after coming into the emergency room. Patient was counseled about the importance of staying on appropriate medicine for treatment of his mania and bipolar. Patient was given a prescription for risperidone 2 mg a day and encouraged to continue with this medicine. He follows up in the community with [facility]. No other change to treatment plan for now. Discontinue IVC. Case reviewed with emergency room physician and TTS.  Disposition: Patient does not meet criteria for psychiatric inpatient admission. Supportive therapy provided about ongoing stressors.  Alethia Berthold, MD 09/10/2016 6:57 PM

## 2016-09-10 NOTE — Discharge Instructions (Signed)
You have been seen in the emergency department for a  psychiatric concern. You have been evaluated both medically as well as psychiatrically. Please follow-up with your outpatient resources provided. Return to the emergency department for any worsening symptoms, or any thoughts of hurting yourself or anyone else so that we may attempt to help you. 

## 2016-09-10 NOTE — ED Provider Notes (Signed)
-----------------------------------------   6:57 AM on 09/10/2016 -----------------------------------------   Blood pressure 139/78, pulse 77, temperature 98 F (36.7 C), temperature source Oral, resp. rate 18, SpO2 99 %.  The patient had no acute events since last update.  Sleeping at this time.  Disposition is pending Psychiatry/Behavioral Medicine team recommendations.     Paulette Blanch, MD 09/10/16 [phone]

## 2016-09-10 NOTE — ED Provider Notes (Signed)
-----------------------------------------   1:14 PM on 09/10/2016 -----------------------------------------  The patient has been seen by psychiatry. They believe the patient is safe for discharge home. He will be writing for risperidone. [name] medical workup has been largely normal.   Harvest Dark, MD 09/10/16 1314

## 2016-09-11 ENCOUNTER — Telehealth: Payer: Self-pay | Admitting: Family Medicine

## 2016-09-11 NOTE — Telephone Encounter (Signed)
Patient would like to speak to you regarding his stay at [facility], would like a call back at [phone]

## 2016-09-11 NOTE — Telephone Encounter (Signed)
Left message on home number as well

## 2016-09-11 NOTE — Telephone Encounter (Signed)
Called and left voicemail on number provided

## 2016-09-11 NOTE — Telephone Encounter (Signed)
To MD

## 2016-09-12 NOTE — Telephone Encounter (Signed)
  I spoke with patient. We discussed his recent psychiatric admissions as well as his return to the hospital on Sunday with homicidal ideations. He has been cleared by psychiatry to be at home. He is follow-up with outpatient psychiatry in July 17 he will also be starting therapy sessions and he is planning to go to a peer support group in [place]. He admits that he went off of his medications and had some stresses with family members passing away his mother having some health problems and his daughter about to leave for college. He states that he feels better now he is back on his meds. The only thing he is not taking is the Risperdal which she states his psychiatrist on the 17 told him he could take if he starts to have any auditory or visual hallucinations. His wife is staying with family members as well as his daughters. He states that he feels okay at home and everyone is calling to check in on him. He has a follow-up appointment on this Friday. He did have a mild abnormal EKG as well as some renal insufficiency that needs to be rechecked. Discussed with him that he is goes to emergency room a call the crisis line for any concerns. He has all the information that he needs.

## 2016-09-14 ENCOUNTER — Encounter: Payer: Self-pay | Admitting: Family Medicine

## 2016-09-14 ENCOUNTER — Ambulatory Visit (INDEPENDENT_AMBULATORY_CARE_PROVIDER_SITE_OTHER): Payer: 59 | Admitting: Family Medicine

## 2016-09-14 VITALS — BP 134/76 | HR 103 | Temp 98.9°F | Resp 18 | Wt 373.6 lb

## 2016-09-14 DIAGNOSIS — R9431 Abnormal electrocardiogram [ECG] [EKG]: Secondary | ICD-10-CM

## 2016-09-14 DIAGNOSIS — F5104 Psychophysiologic insomnia: Secondary | ICD-10-CM | POA: Diagnosis not present

## 2016-09-14 DIAGNOSIS — F3164 Bipolar disorder, current episode mixed, severe, with psychotic features: Secondary | ICD-10-CM

## 2016-09-14 DIAGNOSIS — I10 Essential (primary) hypertension: Secondary | ICD-10-CM | POA: Diagnosis not present

## 2016-09-14 DIAGNOSIS — R7303 Prediabetes: Secondary | ICD-10-CM

## 2016-09-14 LAB — COMPREHENSIVE METABOLIC PANEL
ALK PHOS: 62 U/L (ref 40–115)
ALT: 34 U/L (ref 9–46)
AST: 22 U/L (ref 10–40)
Albumin: 4.4 g/dL (ref 3.6–5.1)
BILIRUBIN TOTAL: 0.4 mg/dL (ref 0.2–1.2)
BUN: 13 mg/dL (ref 7–25)
CO2: 22 mmol/L (ref 20–31)
CREATININE: 0.92 mg/dL (ref 0.60–1.35)
Calcium: 9.7 mg/dL (ref 8.6–10.3)
Chloride: 103 mmol/L (ref 98–110)
GLUCOSE: 98 mg/dL (ref 70–99)
POTASSIUM: 4.7 mmol/L (ref 3.5–5.3)
Sodium: 136 mmol/L (ref 135–146)
Total Protein: 7.5 g/dL (ref 6.1–8.1)

## 2016-09-14 LAB — CBC WITH DIFFERENTIAL/PLATELET
BASOS PCT: 1 %
Basophils Absolute: 60 cells/uL (ref 0–200)
EOS ABS: 420 {cells}/uL (ref 15–500)
Eosinophils Relative: 7 %
HCT: 49.4 % (ref 38.5–50.0)
Hemoglobin: 15.9 g/dL (ref 13.0–17.0)
Lymphocytes Relative: 36 %
Lymphs Abs: 2160 cells/uL (ref 850–3900)
MCH: 27 pg (ref 27.0–33.0)
MCHC: 32.2 g/dL (ref 32.0–36.0)
MCV: 83.9 fL (ref 80.0–100.0)
MONO ABS: 420 {cells}/uL (ref 200–950)
MONOS PCT: 7 %
MPV: 9.5 fL (ref 7.5–12.5)
Neutro Abs: 2940 cells/uL (ref 1500–7800)
Neutrophils Relative %: 49 %
PLATELETS: 374 10*3/uL (ref 140–400)
RBC: 5.89 MIL/uL — AB (ref 4.20–5.80)
RDW: 15 % (ref 11.0–15.0)
WBC: 6 10*3/uL (ref 3.8–10.8)

## 2016-09-14 MED ORDER — LISINOPRIL 10 MG PO TABS: 10.0000 mg | ORAL_TABLET | Freq: Every day | ORAL | 3 refills | Status: DC

## 2016-09-14 MED ORDER — ZOLPIDEM TARTRATE 5 MG PO TABS: 5.0000 mg | ORAL_TABLET | Freq: Every evening | ORAL | 0 refills | Status: DC | PRN

## 2016-09-14 MED ORDER — LAMOTRIGINE 25 MG PO TABS: 25.0000 mg | ORAL_TABLET | Freq: Every day | ORAL | 1 refills | Status: DC

## 2016-09-14 MED ORDER — HYDROXYZINE HCL 25 MG PO TABS: 25.0000 mg | ORAL_TABLET | Freq: Three times a day (TID) | ORAL | 1 refills | Status: DC | PRN

## 2016-09-14 NOTE — Assessment & Plan Note (Signed)
Check A1C he has intentionally lost weight the past 6 months, was at 424lbs

## 2016-09-14 NOTE — Assessment & Plan Note (Signed)
Good control , continue lisinopril, recheck renal function, mild renal insufficieny

## 2016-09-14 NOTE — Assessment & Plan Note (Addendum)
Will contact his psychiatrist to see if they can also do counseling or have recommendations for a therapist. At this time continue current medications, he will hold on starting his risperdal based on the physicians recommenadtion from [facility] and ER.  Note I called his psychiatrist office can not take any new therapy patients,will place a referral to start search in his area

## 2016-09-14 NOTE — Assessment & Plan Note (Signed)
Continue ambien, discussed no marijuana use

## 2016-09-14 NOTE — Progress Notes (Signed)
Subjective:    Patient ID: Jesse Mayer, male    DOB: 03-18-1971, 46 y.o.   MRN: [id number]  Patient presents for medication refills  Patient here for hospital follow-up. He was admitted secondary to bipolar with psychosis he had been off of his medications. He had a stent in behavioral health and was discharged. However when he returned home he again began having some homicidal ideations and hearing some voices therefore he returned back to the emergency room this past Sunday. Of note see my phone note I spoke with them a couple days ago. It was recommended that he use Resporal is needed if he was hearing voices he was on this in the past. He was maintained on his Celexa, restarted on Lamictal as well as hydroxyzine. He has Ambien for sleep. He states that they tried him on trazodone but this did not help. Because of his behaviors his wife and child are staying with family members as his homicidal ideations and illusions were directed at them. He is set to see a psychiatrist in a couple of weeks at, he is also awaiting his counseling to start. He also plans to join some type of peer support group in [place].  On review of his chart he did have some renal insufficiency that needs to be rechecked today creatinine was up to 1.27 A negative alcohol and  Positive drug screen for benzos ( Temezapem which was prescribed) and Marijuana. He states he has stopped smoking marijauna    HTN- taking lisinopril in the hospital, but did not take after our last visit in  Jan, had abnormal EKG shows conduction delay he has history of incomplete RBBB on previous EKG   Psych- [facility] [phone]   Review Of Systems:  GEN- denies fatigue, fever, weight loss,weakness, recent illness HEENT- denies eye drainage, change in vision, nasal discharge, CVS- denies chest pain, palpitations RESP- denies SOB, cough, wheeze ABD- denies N/V, change in stools, abd pain GU- denies dysuria,  hematuria, dribbling, incontinence MSK- denies joint pain, muscle aches, injury Neuro- denies headache, dizziness, syncope, seizure activity       Objective:    BP 134/76   Pulse (!) 103   Temp 98.9 F (37.2 C) (Oral)   Resp 18   Wt (!) 373 lb 9.6 oz (169.5 kg)   SpO2 97%   BMI 46.70 kg/m  GEN- NAD, alert and oriented x3 HEENT- PERRL, EOMI, non injected sclera, pink conjunctiva, MMM, oropharynx clear CVS- RRR, no murmur RESP-CTAB ABD-NABS,soft,NT,ND  Psych- Pleasant, normal affect and mood  Pulses- Radial 2+  EKG- NSR, RSR' variant, previous EKG noted and incomplete RBBB      Assessment & Plan:      Problem List Items Addressed This Visit    Morbid obesity ([place])   HTN (hypertension)    Good control , continue lisinopril, recheck renal function, mild renal insufficieny      Relevant Medications   lisinopril (PRINIVIL,ZESTRIL) 10 MG tablet   Other Relevant Orders   CBC with Differential/Platelet   Comprehensive metabolic panel   Chronic insomnia    Continue ambien, discussed no marijuana use      Borderline diabetes    Check A1C he has intentionally lost weight the past 6 months, was at 424lbs      Relevant Orders   Hemoglobin A1c   Bipolar disorder, curr episode mixed, severe, with psychotic features ([place]) - Primary    Will contact his psychiatrist to see if they can  also do counseling or have recommendations for a therapist. At this time continue current medications, he will hold on starting his risperdal based on the physicians recommenadtion from [facility] and ER.  Note I called his psychiatrist office can not take any new therapy patients,will place a referral to start search in his area      Relevant Orders   Ambulatory referral to Psychology    Other Visit Diagnoses    Abnormal EKG       Incomplete RBBB noted back to 2015, no changes on recent EKG, no symptoms just monitor   Relevant Orders   EKG 12-Lead (Completed)      Note: This dictation was  prepared with Dragon dictation along with smaller phrase technology. Any transcriptional errors that result from this process are unintentional.

## 2016-09-14 NOTE — Patient Instructions (Addendum)
We will call with lab results We will check into the therapy Dr. [facility]  Take the blood pressure medication F/U 2 months for PHYSICAL

## 2016-09-15 LAB — HEMOGLOBIN A1C
HEMOGLOBIN A1C: 5.7 % — AB (ref <5.7)
Mean Plasma Glucose: 117 mg/dL

## 2016-09-17 ENCOUNTER — Inpatient Hospital Stay: Payer: 59 | Admitting: Family Medicine

## 2016-09-20 ENCOUNTER — Encounter: Payer: Self-pay | Admitting: *Deleted

## 2016-09-28 ENCOUNTER — Other Ambulatory Visit: Payer: Self-pay | Admitting: Family Medicine

## 2016-09-28 MED ORDER — RISPERIDONE 2 MG PO TABS: 2.0000 mg | ORAL_TABLET | Freq: Every day | ORAL | 2 refills | Status: DC

## 2016-09-28 NOTE — Telephone Encounter (Signed)
I called patient multiple times on his home phone and his cell phone. We'll try him this weekend. I noted that he declined the therapist that we found close to his home. We are he spoke with his psychiatrist and they do not have any therapists available. I'm concerned that he is not following through likely discuss and he needs intensive therapy. Also up to see how his sleep and mood have been CT needs to go back on his Risperdal. There were some concerns from his wife.  I spoke with patient 5:35pm  ( I had to call wife's phone first , he said he was taking a NAP)  he states that he is doing fine that he is sleeping fine feels good. I asked him why he did not schedule with the therapist he states he does not remember that conversation. I think this is part of his bipolar which is still not under good control. I gave him the number to Ms. Oletta Darter which is near his home as his psychiatrist does not have any therapies available we have are recalled and check into this. He is on give her a call on Monday. He still declines taking the Risperdal.

## 2016-10-03 ENCOUNTER — Other Ambulatory Visit: Payer: Self-pay | Admitting: *Deleted

## 2016-10-03 MED ORDER — LISINOPRIL 10 MG PO TABS: 10.0000 mg | ORAL_TABLET | Freq: Every day | ORAL | 3 refills | Status: DC

## 2016-10-03 MED ORDER — HYDROXYZINE HCL 25 MG PO TABS: 25.0000 mg | ORAL_TABLET | Freq: Three times a day (TID) | ORAL | 1 refills | Status: DC | PRN

## 2016-10-09 ENCOUNTER — Telehealth: Payer: Self-pay | Admitting: *Deleted

## 2016-10-09 ENCOUNTER — Encounter: Payer: Self-pay | Admitting: *Deleted

## 2016-10-09 NOTE — Telephone Encounter (Signed)
Received call from patient.   States that release of work from inpatient hospitalization ended on 10/09/2016. States that he requires letter for work extending leave until patient can be seen at [facility] on 11/05/2016.  MD please advise.

## 2016-10-09 NOTE — Telephone Encounter (Signed)
Letter transcribed.   Call placed to patient and patient made aware.   Requested letter to be e-mailed to him at [email or web address].

## 2016-10-09 NOTE — Telephone Encounter (Signed)
Okay to write letter extending leave to 8/13. Decision on further work leave will be made by psychiatrist at that visit.

## 2016-10-11 ENCOUNTER — Other Ambulatory Visit: Payer: Self-pay | Admitting: *Deleted

## 2016-10-11 MED ORDER — HYDROXYZINE HCL 25 MG PO TABS: 25.0000 mg | ORAL_TABLET | Freq: Three times a day (TID) | ORAL | 1 refills | Status: DC | PRN

## 2016-10-16 ENCOUNTER — Other Ambulatory Visit: Payer: Self-pay | Admitting: *Deleted

## 2016-10-16 MED ORDER — LAMOTRIGINE 25 MG PO TABS: 25.0000 mg | ORAL_TABLET | Freq: Every day | ORAL | 3 refills | Status: DC

## 2016-10-16 MED ORDER — LISINOPRIL 10 MG PO TABS: 10.0000 mg | ORAL_TABLET | Freq: Every day | ORAL | 3 refills | Status: AC

## 2016-10-16 MED ORDER — HYDROXYZINE HCL 25 MG PO TABS: 25.0000 mg | ORAL_TABLET | Freq: Three times a day (TID) | ORAL | 1 refills | Status: DC | PRN

## 2016-11-16 ENCOUNTER — Telehealth: Payer: Self-pay | Admitting: *Deleted

## 2016-11-16 MED ORDER — NICOTINE 21 MG/24HR TD PT24: 21.0000 mg | MEDICATED_PATCH | Freq: Every day | TRANSDERMAL | 0 refills | Status: DC

## 2016-11-16 MED ORDER — NICOTINE 7 MG/24HR TD PT24: 7.0000 mg | MEDICATED_PATCH | Freq: Every day | TRANSDERMAL | 0 refills | Status: AC

## 2016-11-16 MED ORDER — NICOTINE 14 MG/24HR TD PT24: 14.0000 mg | MEDICATED_PATCH | Freq: Every day | TRANSDERMAL | 0 refills | Status: AC

## 2016-11-16 NOTE — Telephone Encounter (Signed)
Received call from patient.   Requested prescription for nicotine patches to e sent to [facility] for smoking cessation.   MD please advise.

## 2016-11-16 NOTE — Telephone Encounter (Signed)
Okay to send 21mg  then 14, and 7

## 2016-11-16 NOTE — Telephone Encounter (Signed)
Prescription sent to pharmacy.

## 2017-01-25 ENCOUNTER — Ambulatory Visit (INDEPENDENT_AMBULATORY_CARE_PROVIDER_SITE_OTHER): Payer: 59 | Admitting: Family Medicine

## 2017-01-25 ENCOUNTER — Encounter: Payer: Self-pay | Admitting: Family Medicine

## 2017-01-25 VITALS — BP 134/80 | HR 84 | Temp 98.5°F | Resp 16 | Ht 76.0 in | Wt 322.0 lb

## 2017-01-25 DIAGNOSIS — E669 Obesity, unspecified: Secondary | ICD-10-CM

## 2017-01-25 DIAGNOSIS — R7303 Prediabetes: Secondary | ICD-10-CM

## 2017-01-25 DIAGNOSIS — E785 Hyperlipidemia, unspecified: Secondary | ICD-10-CM

## 2017-01-25 DIAGNOSIS — Z125 Encounter for screening for malignant neoplasm of prostate: Secondary | ICD-10-CM

## 2017-01-25 DIAGNOSIS — Z720 Tobacco use: Secondary | ICD-10-CM | POA: Diagnosis not present

## 2017-01-25 DIAGNOSIS — Z0000 Encounter for general adult medical examination without abnormal findings: Secondary | ICD-10-CM

## 2017-01-25 MED ORDER — ZOLPIDEM TARTRATE 10 MG PO TABS: 10.0000 mg | ORAL_TABLET | Freq: Every evening | ORAL | 3 refills | Status: AC | PRN

## 2017-01-25 MED ORDER — LAMOTRIGINE 100 MG PO TABS: 100.0000 mg | ORAL_TABLET | Freq: Every day | ORAL | 0 refills | Status: AC

## 2017-01-25 NOTE — Assessment & Plan Note (Signed)
CPE done, fasting labs obtained, congraluated on weight loss. CONTINUE with healthy eating, keeping active. Mood improved more stability wiht current meds

## 2017-01-25 NOTE — Patient Instructions (Signed)
F/U 6 months

## 2017-01-25 NOTE — Assessment & Plan Note (Signed)
Counseled on cessation.  He is not ready to quit but does have the patches at home.

## 2017-01-25 NOTE — Progress Notes (Signed)
   Subjective:    Patient ID: Jesse Mayer, male    DOB: 1970-09-02, 46 y.o.   MRN: [id number]  Patient presents for CPE (is fasting)   Pt here for CPE Medications reviewed Immunizations- Due for flu shot, TDAP UTD  Due for HIV screening, fasting labs He last has 50lbs since our visit in June   Smoking- no long using Nicoderm patches, has some at the house, but still smoking   Obesity- has intentionally lost weight,   Borderline DM  HTN- takin glisinopril as prescribed   Psychiatry- Dr. Imagene Sheller, currently on lamictal 100mg , has visit 12th for medications, taking hydroxyzing for anxiety taking twice a day  Taking 10mg  of Ambien for sleep   Father history of prostate cancer    Review Of Systems:  GEN- denies fatigue, fever, weight loss,weakness, recent illness HEENT- denies eye drainage, change in vision, nasal discharge, CVS- denies chest pain, palpitations RESP- denies SOB, cough, wheeze ABD- denies N/V, change in stools, abd pain GU- denies dysuria, hematuria, dribbling, incontinence MSK- denies joint pain, muscle aches, injury Neuro- denies headache, dizziness, syncope, seizure activity       Objective:    BP 134/80   Pulse 84   Temp 98.5 F (36.9 C) (Oral)   Resp 16   Ht 6\' 4"  (1.93 m)   Wt (!) 322 lb (146.1 kg)   SpO2 98%   BMI 39.20 kg/m  GEN- NAD, alert and oriented x3 HEENT- PERRL, EOMI, non injected sclera, pink conjunctiva, MMM, oropharynx clear Neck- Supple, no thyromegaly CVS- RRR, no murmur RESP-CTAB ABD-NABS,soft,NT,ND Psych- normal afffect and mood, no hallucinations  EXT- No edema Pulses- Radial, DP- 2+        Assessment & Plan:      Problem List Items Addressed This Visit      Unprioritized   Mild hyperlipidemia   Relevant Orders   Lipid panel   Borderline diabetes   Relevant Orders   Hemoglobin A1c   Tobacco user    Counseled on cessation.  He is not ready to quit but does have the patches at home.      Routine  general medical examination at a health care facility - Primary    CPE done, fasting labs obtained, congraluated on weight loss. CONTINUE with healthy eating, keeping active. Mood improved more stability wiht current meds      Relevant Orders   CBC with Differential/Platelet   Comprehensive metabolic panel   HIV antibody   Obesity (BMI 30-39.9)    Other Visit Diagnoses    Prostate cancer screening       Family history 1 st degree relative obtain PSA   Relevant Orders   PSA      Note: This dictation was prepared with Dragon dictation along with smaller phrase technology. Any transcriptional errors that result from this process are unintentional.

## 2017-01-26 LAB — CBC WITH DIFFERENTIAL/PLATELET
BASOS ABS: 50 {cells}/uL (ref 0–200)
Basophils Relative: 0.9 %
EOS PCT: 8.9 %
Eosinophils Absolute: 490 cells/uL (ref 15–500)
HCT: 51.6 % — ABNORMAL HIGH (ref 38.5–50.0)
Hemoglobin: 16.9 g/dL (ref 13.2–17.1)
Lymphs Abs: 2024 cells/uL (ref 850–3900)
MCH: 26.5 pg — ABNORMAL LOW (ref 27.0–33.0)
MCHC: 32.8 g/dL (ref 32.0–36.0)
MCV: 80.9 fL (ref 80.0–100.0)
MONOS PCT: 6.2 %
MPV: 9.3 fL (ref 7.5–12.5)
NEUTROS PCT: 47.2 %
Neutro Abs: 2596 cells/uL (ref 1500–7800)
Platelets: 486 10*3/uL — ABNORMAL HIGH (ref 140–400)
RBC: 6.38 10*6/uL — ABNORMAL HIGH (ref 4.20–5.80)
RDW: 14.7 % (ref 11.0–15.0)
TOTAL LYMPHOCYTE: 36.8 %
WBC mixed population: 341 cells/uL (ref 200–950)
WBC: 5.5 10*3/uL (ref 3.8–10.8)

## 2017-01-26 LAB — COMPREHENSIVE METABOLIC PANEL
AG Ratio: 1.5 (calc) (ref 1.0–2.5)
ALT: 19 U/L (ref 9–46)
AST: 15 U/L (ref 10–40)
Albumin: 4.4 g/dL (ref 3.6–5.1)
Alkaline phosphatase (APISO): 59 U/L (ref 40–115)
BUN: 15 mg/dL (ref 7–25)
CHLORIDE: 102 mmol/L (ref 98–110)
CO2: 27 mmol/L (ref 20–32)
Calcium: 9.8 mg/dL (ref 8.6–10.3)
Creat: 1.07 mg/dL (ref 0.60–1.35)
GLUCOSE: 100 mg/dL — AB (ref 65–99)
Globulin: 3 g/dL (calc) (ref 1.9–3.7)
Potassium: 5.3 mmol/L (ref 3.5–5.3)
SODIUM: 137 mmol/L (ref 135–146)
TOTAL PROTEIN: 7.4 g/dL (ref 6.1–8.1)
Total Bilirubin: 0.6 mg/dL (ref 0.2–1.2)

## 2017-01-26 LAB — HEMOGLOBIN A1C
EAG (MMOL/L): 5.8 (calc)
HEMOGLOBIN A1C: 5.3 %{Hb} (ref <5.7)
MEAN PLASMA GLUCOSE: 105 (calc)

## 2017-01-26 LAB — LIPID PANEL
CHOL/HDL RATIO: 5.2 (calc) — AB (ref <5.0)
Cholesterol: 203 mg/dL — ABNORMAL HIGH (ref <200)
HDL: 39 mg/dL — AB (ref >40)
LDL Cholesterol (Calc): 144 mg/dL (calc) — ABNORMAL HIGH
NON-HDL CHOLESTEROL (CALC): 164 mg/dL — AB (ref <130)
Triglycerides: 94 mg/dL (ref <150)

## 2017-01-26 LAB — HIV ANTIBODY (ROUTINE TESTING W REFLEX): HIV 1&2 Ab, 4th Generation: NONREACTIVE

## 2017-01-26 LAB — PSA: PSA: 0.4 ng/mL (ref <=4.0)

## 2017-01-31 ENCOUNTER — Encounter: Payer: Self-pay | Admitting: *Deleted

## 2017-02-21 ENCOUNTER — Other Ambulatory Visit: Payer: Self-pay | Admitting: Family Medicine

## 2017-02-22 ENCOUNTER — Other Ambulatory Visit: Payer: Self-pay | Admitting: *Deleted

## 2017-02-22 MED ORDER — NICOTINE 21 MG/24HR TD PT24: MEDICATED_PATCH | TRANSDERMAL | 0 refills | Status: AC

## 2017-07-26 ENCOUNTER — Ambulatory Visit: Payer: 59 | Admitting: Family Medicine

## 2017-08-13 ENCOUNTER — Ambulatory Visit: Payer: 59 | Admitting: Family Medicine

## 2018-01-24 ENCOUNTER — Other Ambulatory Visit: Payer: Self-pay | Admitting: Family Medicine

## 2018-01-24 NOTE — Telephone Encounter (Signed)
Medication filled x1 with no refills.   Requires office visit before any further refills can be given.   Letter sent.  

## 2018-02-27 ENCOUNTER — Other Ambulatory Visit: Payer: Self-pay | Admitting: Family Medicine

## 2018-09-22 IMAGING — DX DG ELBOW COMPLETE 3+V*L*
4 series · 4 of 4 positions shown · non-contrast
Comparison: None.

CLINICAL DATA: Fall onto left elbow yesterday with pain swelling.
Initial encounter.

EXAM:
LEFT ELBOW - COMPLETE 3+ VIEW

[x elbow ap left]
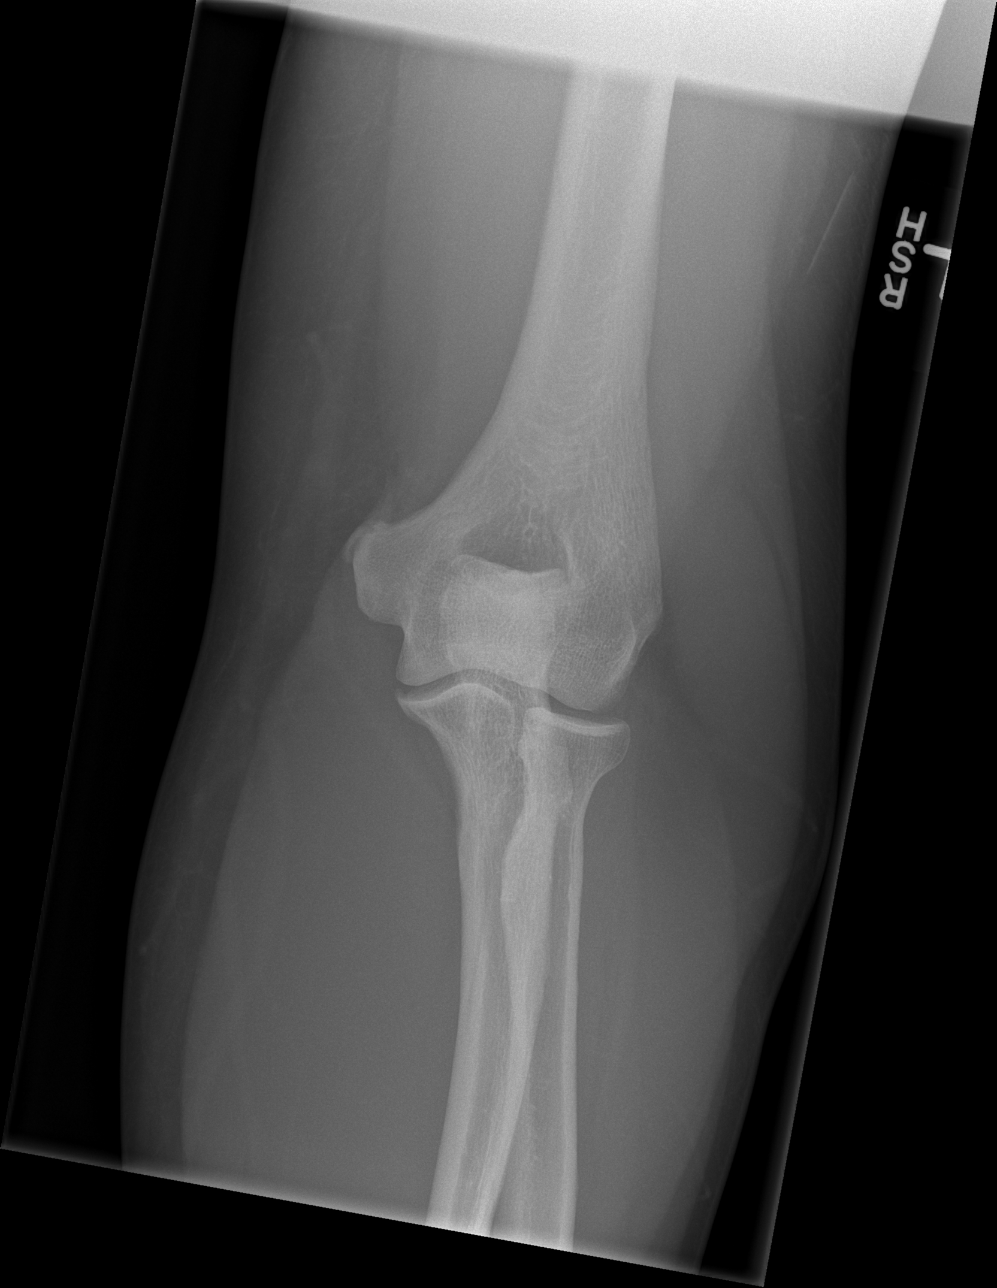

[x elbow obl left (1 of 2)]
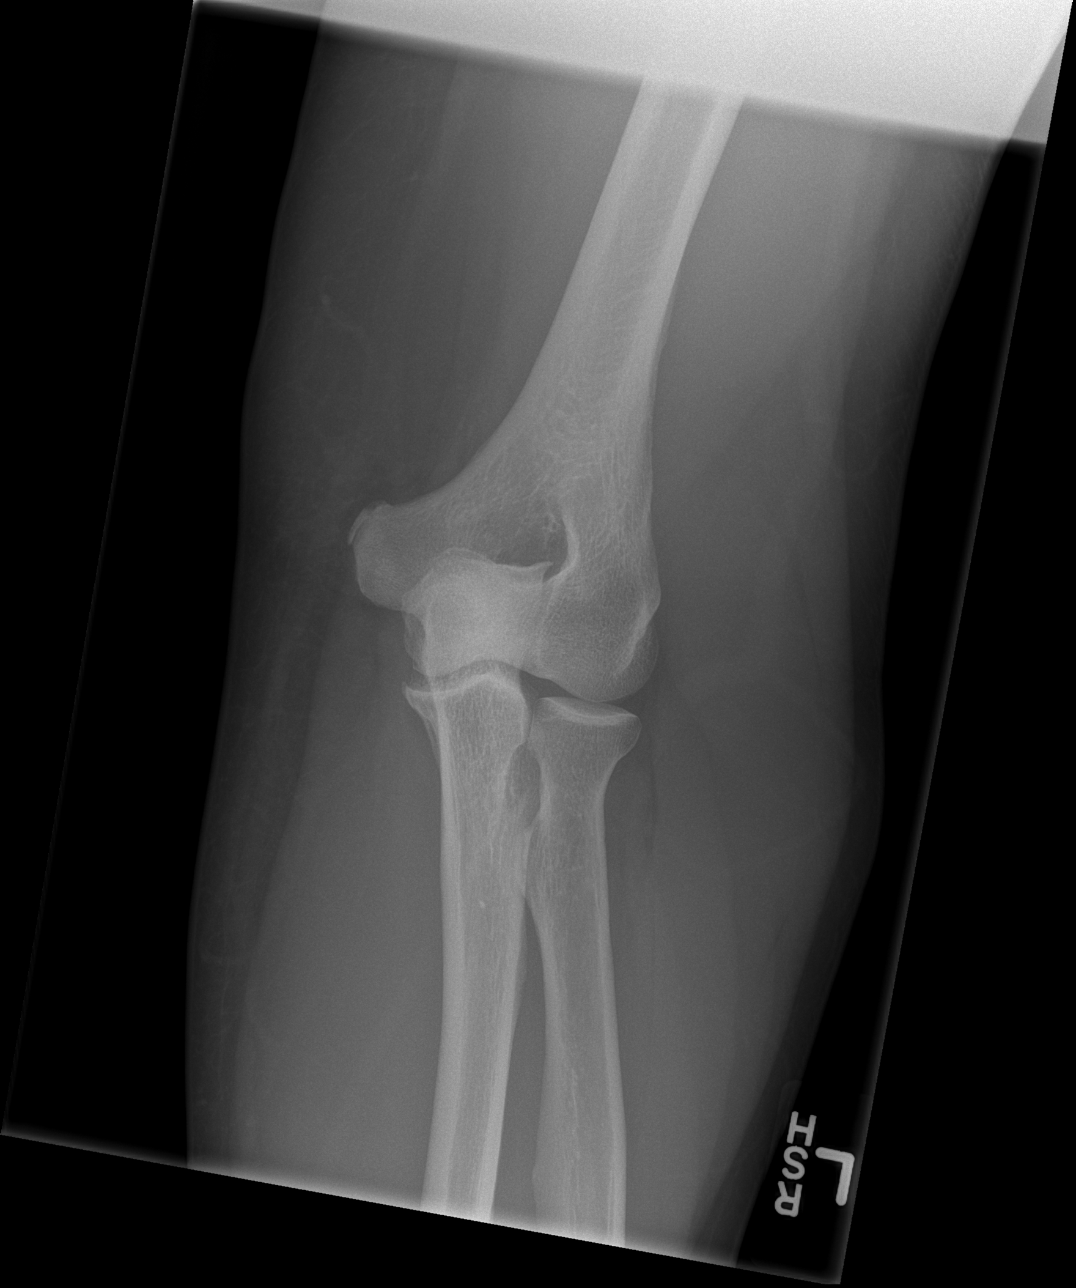

[x elbow obl left (2 of 2)]
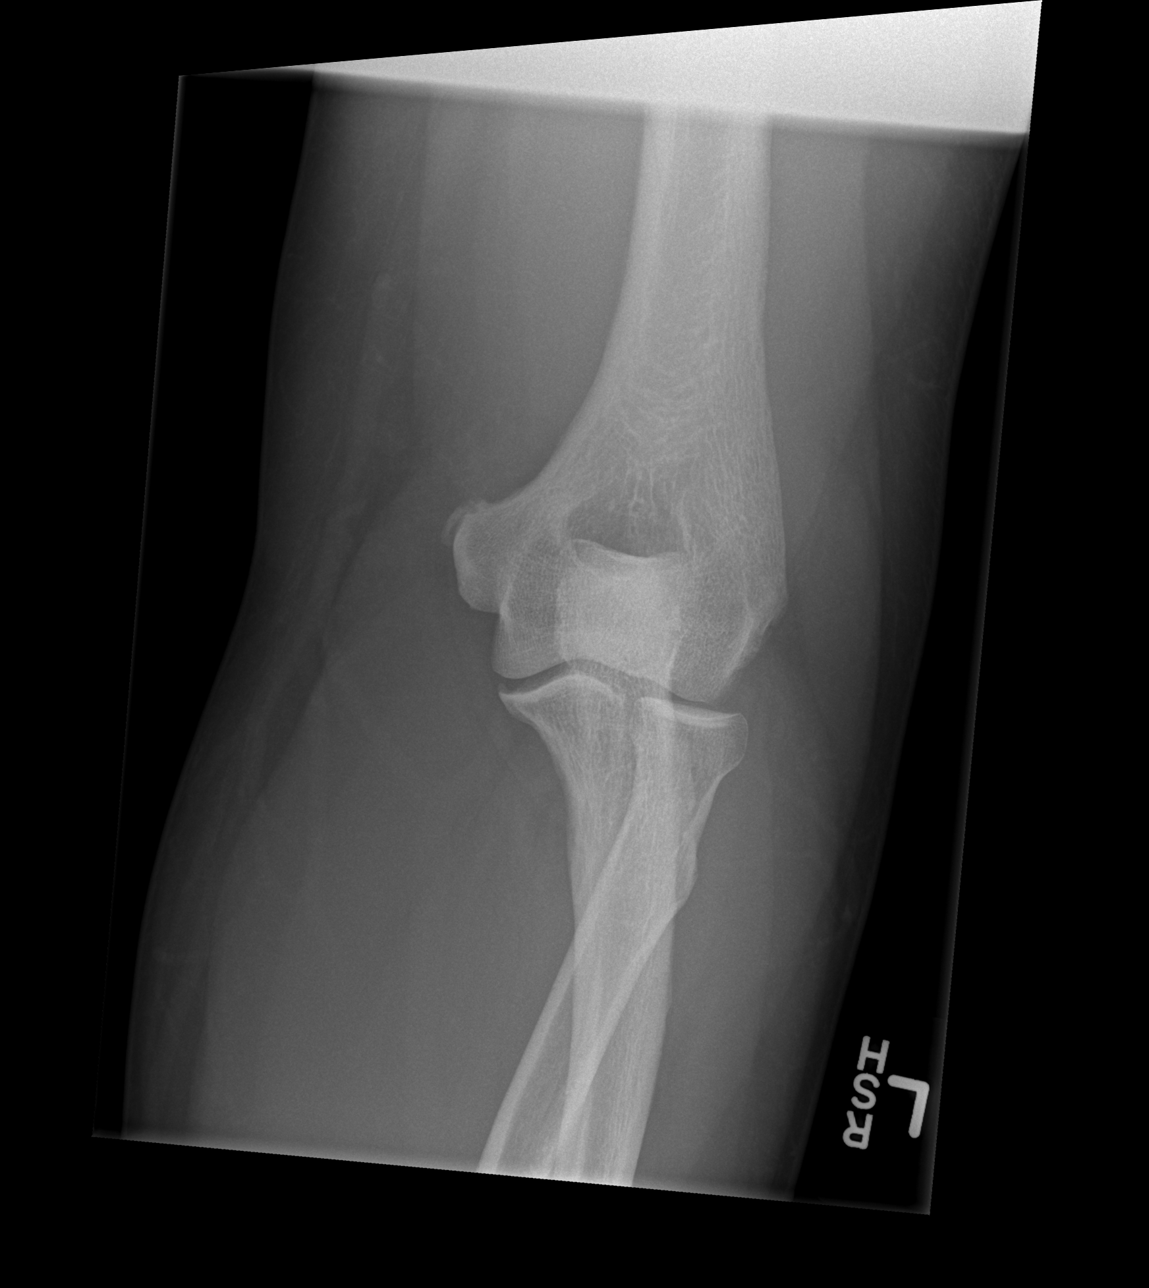

[x elbow lat left]
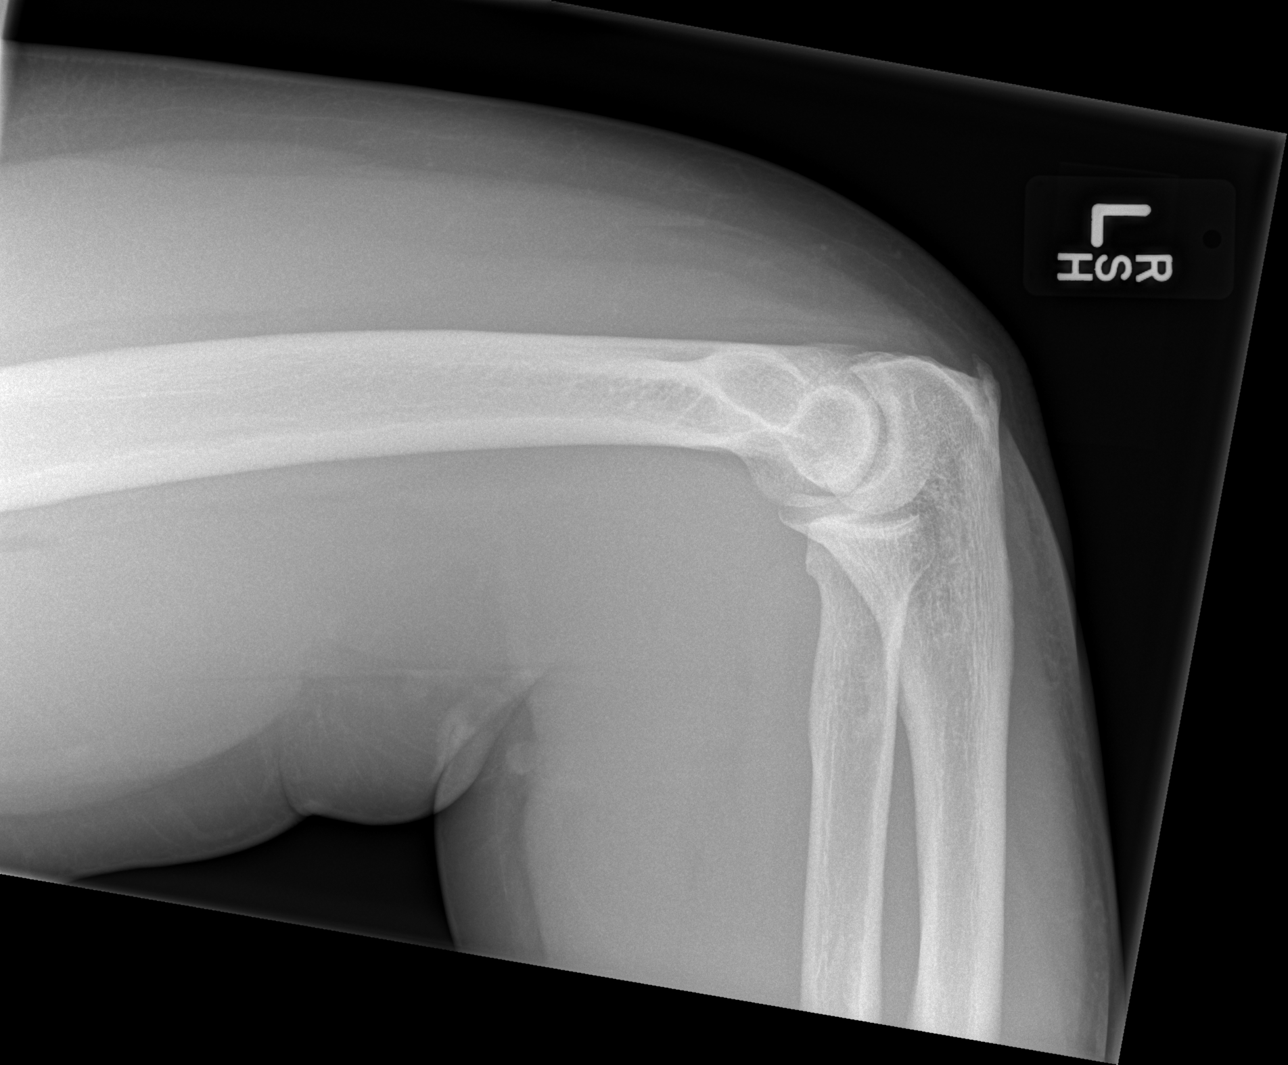

[4 of 4 positions shown; findings below may reference images not displayed]

FINDINGS: Irregularity along the outer aspect of the medial epicondyles of the
distal humerus is potentially representative of an acute injury,
although this could also represent an old injury or an unfused
apophysis. Correlation suggested with focal tenderness over the
region of the medial epicondyle. If this is an acute avulsion, this
is minimally displaced.

The rest of the elbow shows no evidence of fracture or dislocation.
No joint effusion identified. No bony lesion or evidence of
significant arthropathy.
IMPRESSION: Possible acute avulsion fracture along the outer aspect of the
medial epicondyle. This could also potentially represent an old
injury or unfused apophysis and correlation suggested with any focal
tenderness over this region clinically.

## 2018-12-04 ENCOUNTER — Ambulatory Visit (INDEPENDENT_AMBULATORY_CARE_PROVIDER_SITE_OTHER): Payer: Medicaid Other | Admitting: Internal Medicine

## 2018-12-04 ENCOUNTER — Other Ambulatory Visit: Payer: Medicaid Other

## 2018-12-04 ENCOUNTER — Encounter (INDEPENDENT_AMBULATORY_CARE_PROVIDER_SITE_OTHER): Payer: Self-pay | Admitting: Internal Medicine

## 2018-12-04 VITALS — BP 119/75 | HR 90 | Temp 98.0°F | Wt 392.0 lb

## 2018-12-04 DIAGNOSIS — E559 Vitamin D deficiency, unspecified: Secondary | ICD-10-CM

## 2018-12-04 DIAGNOSIS — Z23 Encounter for immunization: Secondary | ICD-10-CM

## 2018-12-04 DIAGNOSIS — Z800 Family history of malignant neoplasm of digestive organs: Secondary | ICD-10-CM | POA: Insufficient documentation

## 2018-12-04 DIAGNOSIS — R0683 Snoring: Secondary | ICD-10-CM

## 2018-12-04 DIAGNOSIS — N400 Enlarged prostate without lower urinary tract symptoms: Secondary | ICD-10-CM

## 2018-12-04 DIAGNOSIS — F3174 Bipolar disorder, in full remission, most recent episode manic: Secondary | ICD-10-CM

## 2018-12-04 DIAGNOSIS — I10 Essential (primary) hypertension: Secondary | ICD-10-CM

## 2018-12-04 DIAGNOSIS — Z0000 Encounter for general adult medical examination without abnormal findings: Secondary | ICD-10-CM

## 2018-12-04 MED ORDER — RISPERIDONE 2 MG PO TABS
2.00 mg | ORAL_TABLET | Freq: Every day | ORAL | 3 refills | Status: AC
Start: 2018-12-04 — End: ?

## 2018-12-04 MED ORDER — LAMOTRIGINE 150 MG PO TABS
150.00 mg | ORAL_TABLET | Freq: Two times a day (BID) | ORAL | 3 refills | Status: AC
Start: 2018-12-04 — End: ?

## 2018-12-04 MED ORDER — LISINOPRIL 10 MG PO TABS
10.00 mg | ORAL_TABLET | Freq: Every day | ORAL | 3 refills | Status: AC
Start: 2018-12-04 — End: ?

## 2018-12-04 MED ORDER — ZOLPIDEM TARTRATE 10 MG PO TABS
10.00 mg | ORAL_TABLET | Freq: Every evening | ORAL | 0 refills | Status: AC | PRN
Start: 2018-12-04 — End: ?

## 2018-12-04 NOTE — Progress Notes (Signed)
Subjective:      Patient ID: Grant Hess is a 48 y.o. male.    Chief Complaint:  Chief Complaint   Patient presents with    Annual Exam     pt is fasting        HPI:  The patient presents to establish care; needs meds refilled ;   Also needs referrals to specialists; obesity has not been addressed; patient is having trouble acsessing psychiatric care;         Problem List:  Patient Active Problem List   Diagnosis    Essential hypertension    Bipolar disorder, in full remission, most recent episode manic    Family history of colon cancer       Current Medications:  No outpatient medications have been marked as taking for the 12/04/18 encounter (Office Visit) with Gerlene Fee, MD.         Allergies:  No Known Allergies    Past Medical History:  Past Medical History:   Diagnosis Date    Hyperlipidemia     Hypertension     Obesity        Past Surgical History:  History reviewed. No pertinent surgical history.    Family History:  History reviewed. No pertinent family history.    Social History:  Social History     Tobacco Use    Smoking status: Never Smoker    Smokeless tobacco: Former Estate agent Use Topics    Alcohol use: Yes     Comment: socially     Drug use: Never       The following sections were reviewed this encounter by the provider:   Tobacco   Allergies   Meds   Problems   Med Hx   Surg Hx   Fam Hx          ROS:  Review of Systems   Constitutional: Negative for activity change and fatigue.   Respiratory: Negative for chest tightness and shortness of breath.    Cardiovascular: Negative for chest pain and palpitations.   Gastrointestinal: Negative for abdominal pain.         Vitals:  BP 119/75 (BP Site: Left arm, Patient Position: Sitting)    Pulse 90    Temp 98 F (36.7 C) (Temporal)    Wt (!) 177.8 kg (392 lb)     Objective:     Physical Exam:  Physical Exam  Constitutional:       Appearance: He is well-developed.   Cardiovascular:      Rate and Rhythm: Normal rate and regular rhythm.       Heart sounds: Normal heart sounds.   Pulmonary:      Effort: Pulmonary effort is normal. No respiratory distress.      Breath sounds: Normal breath sounds.   Neurological:      Mental Status: He is alert.           Assessment:     1. Essential hypertension  - lisinopril (ZESTRIL) 10 MG tablet; Take 1 tablet (10 mg total) by mouth daily  Dispense: 90 tablet; Refill: 3  - CBC and differential  - Comprehensive metabolic panel  - TSH  - Lipid panel  - Urinalysis    2. Bipolar disorder, in full remission, most recent episode manic  - Behavioral Health Referral: Heloise Ochoa, MD [facility])    3. Benign prostatic hyperplasia without lower urinary tract symptoms  - PSA    4. Vitamin D deficiency  -  Vitamin D,25 OH, Total    5. Need for vaccination  - Flu vacc QUAD PF 6 MOS & UP (FLULAVAL/FlUARIX)    6. Family history of colon cancer  - Colorectal Surgery Referral: Estanislado Spire. Venetia Maxon, MD [facility])    7. Snoring  - Ambulatory referral to Sleep Studies - [place]    8. Class 3 severe obesity due to excess calories with serious comorbidity in adult, unspecified BMI  - [facility] to [facility]      Plan:           Follow-Up:   Return in about 4 months (around 04/05/2019).       Health Maintenance Due:     There are no preventive care reminders to display for this patient.      Gerlene Fee, MD

## 2018-12-05 LAB — COMPREHENSIVE METABOLIC PANEL
ALT: 28 IU/L (ref 0–44)
AST (SGOT): 27 IU/L (ref 0–40)
Albumin/Globulin Ratio: 1.9 (ref 1.2–2.2)
Albumin: 5 g/dL (ref 4.0–5.0)
Alkaline Phosphatase: 52 IU/L (ref 39–117)
BUN / Creatinine Ratio: 13 (ref 9–20)
BUN: 14 mg/dL (ref 6–24)
Bilirubin, Total: 0.7 mg/dL (ref 0.0–1.2)
CO2: 24 mmol/L (ref 20–29)
Calcium: 9.6 mg/dL (ref 8.7–10.2)
Chloride: 99 mmol/L (ref 96–106)
Creatinine: 1.11 mg/dL (ref 0.76–1.27)
EGFR: 79 mL/min/{1.73_m2} (ref >59)
EGFR: 92 mL/min/{1.73_m2} (ref >59)
Globulin, Total: 2.7 g/dL (ref 1.5–4.5)
Glucose: 107 mg/dL — ABNORMAL HIGH (ref 65–99)
Potassium: 4.7 mmol/L (ref 3.5–5.2)
Protein, Total: 7.7 g/dL (ref 6.0–8.5)
Sodium: 139 mmol/L (ref 134–144)

## 2018-12-05 LAB — CBC AND DIFFERENTIAL
Baso(Absolute): 0.1 10*3/uL (ref 0.0–0.2)
Basos: 1 %
Eos: 3 %
Eosinophils Absolute: 0.2 10*3/uL (ref 0.0–0.4)
Hematocrit: 47 % (ref 37.5–51.0)
Hemoglobin: 15.6 g/dL (ref 13.0–17.7)
Immature Granulocytes Absolute: 0 10*3/uL (ref 0.0–0.1)
Immature Granulocytes: 1 %
Lymphocytes Absolute: 2.1 10*3/uL (ref 0.7–3.1)
Lymphocytes: 42 %
MCH: 27.6 pg (ref 26.6–33.0)
MCHC: 33.2 g/dL (ref 31.5–35.7)
MCV: 83 fL (ref 79–97)
Monocytes Absolute: 0.4 10*3/uL (ref 0.1–0.9)
Monocytes: 8 %
Neutrophils Absolute: 2.2 10*3/uL (ref 1.4–7.0)
Neutrophils: 45 %
Platelets: 404 10*3/uL (ref 150–450)
RBC: 5.65 x10E6/uL (ref 4.14–5.80)
RDW: 14 % (ref 11.6–15.4)
WBC: 4.9 10*3/uL (ref 3.4–10.8)

## 2018-12-05 LAB — LIPID PANEL
Cholesterol / HDL Ratio: 5.6 ratio — ABNORMAL HIGH (ref 0.0–5.0)
Cholesterol: 220 mg/dL — ABNORMAL HIGH (ref 100–199)
HDL: 39 mg/dL — ABNORMAL LOW (ref >39)
LDL Chol Calculated (NIH): 163 mg/dL — ABNORMAL HIGH (ref 0–99)
Triglycerides: 98 mg/dL (ref 0–149)
VLDL Calculated: 18 mg/dL (ref 5–40)

## 2018-12-05 LAB — VITAMIN D,25 OH,TOTAL: Vitamin D 25-Hydroxy: 9.9 ng/mL — ABNORMAL LOW (ref 30.0–100.0)

## 2018-12-05 LAB — PSA: Prostate Specific Antigen, Total: 0.2 ng/mL (ref 0.0–4.0)

## 2018-12-05 LAB — TSH: TSH: 1.04 u[IU]/mL (ref 0.450–4.500)

## 2018-12-05 LAB — HEMOGLOBIN A1C: Hemoglobin A1C: 5.7 % — ABNORMAL HIGH (ref 4.8–5.6)

## 2018-12-08 ENCOUNTER — Other Ambulatory Visit (INDEPENDENT_AMBULATORY_CARE_PROVIDER_SITE_OTHER): Payer: Self-pay | Admitting: Internal Medicine

## 2018-12-08 ENCOUNTER — Encounter (INDEPENDENT_AMBULATORY_CARE_PROVIDER_SITE_OTHER): Payer: Self-pay | Admitting: Internal Medicine

## 2018-12-08 DIAGNOSIS — E782 Mixed hyperlipidemia: Secondary | ICD-10-CM

## 2018-12-08 MED ORDER — ROSUVASTATIN CALCIUM 10 MG PO TABS
10.00 mg | ORAL_TABLET | Freq: Every evening | ORAL | 0 refills | Status: AC
Start: 2018-12-08 — End: ?

## 2018-12-10 ENCOUNTER — Telehealth (INDEPENDENT_AMBULATORY_CARE_PROVIDER_SITE_OTHER): Payer: Medicaid Other | Admitting: Registered"

## 2018-12-12 ENCOUNTER — Ambulatory Visit (INDEPENDENT_AMBULATORY_CARE_PROVIDER_SITE_OTHER): Payer: Self-pay | Admitting: Family Medicine

## 2018-12-16 ENCOUNTER — Encounter (INDEPENDENT_AMBULATORY_CARE_PROVIDER_SITE_OTHER): Payer: Self-pay

## 2019-04-06 ENCOUNTER — Ambulatory Visit (INDEPENDENT_AMBULATORY_CARE_PROVIDER_SITE_OTHER): Payer: Medicaid Other | Admitting: Internal Medicine

## 2019-07-13 ENCOUNTER — Other Ambulatory Visit (INDEPENDENT_AMBULATORY_CARE_PROVIDER_SITE_OTHER): Payer: Self-pay | Admitting: Internal Medicine

## 2022-12-19 ENCOUNTER — Encounter (INDEPENDENT_AMBULATORY_CARE_PROVIDER_SITE_OTHER): Payer: Self-pay | Admitting: *Deleted
